# Patient Record
Sex: Female | Born: 1995 | Race: White | Hispanic: No | Marital: Married | Smoking: Never smoker
Health system: Southern US, Community
[De-identification: ages and names within clinical notes are randomized; demographics above are authoritative.]

## PROBLEM LIST (undated history)

## (undated) ENCOUNTER — Emergency Department (HOSPITAL_COMMUNITY): Admission: EM | Payer: Commercial Managed Care - PPO | Source: Home / Self Care

## (undated) DIAGNOSIS — F909 Attention-deficit hyperactivity disorder, unspecified type: Secondary | ICD-10-CM

## (undated) DIAGNOSIS — F419 Anxiety disorder, unspecified: Secondary | ICD-10-CM

## (undated) DIAGNOSIS — B001 Herpesviral vesicular dermatitis: Secondary | ICD-10-CM

## (undated) DIAGNOSIS — F329 Major depressive disorder, single episode, unspecified: Secondary | ICD-10-CM

## (undated) DIAGNOSIS — B009 Herpesviral infection, unspecified: Secondary | ICD-10-CM

## (undated) DIAGNOSIS — F32A Depression, unspecified: Secondary | ICD-10-CM

## (undated) HISTORY — DX: Herpesviral vesicular dermatitis: B00.1

## (undated) HISTORY — DX: Major depressive disorder, single episode, unspecified: F32.9

## (undated) HISTORY — PX: INTRAUTERINE DEVICE (IUD) INSERTION: SHX5877

## (undated) HISTORY — DX: Depression, unspecified: F32.A

## (undated) HISTORY — DX: Herpesviral infection, unspecified: B00.9

## (undated) HISTORY — PX: OTHER SURGICAL HISTORY: SHX169

---
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
## 2018-11-12 ENCOUNTER — Other Ambulatory Visit: Payer: Self-pay

## 2018-11-12 ENCOUNTER — Encounter: Payer: Self-pay | Admitting: Obstetrics & Gynecology

## 2018-11-12 ENCOUNTER — Ambulatory Visit (INDEPENDENT_AMBULATORY_CARE_PROVIDER_SITE_OTHER): Payer: Commercial Managed Care - PPO | Admitting: Obstetrics & Gynecology

## 2018-11-12 VITALS — BP 122/62 | HR 68 | Temp 98.1°F | Resp 16 | Ht 68.75 in | Wt 143.0 lb

## 2018-11-12 DIAGNOSIS — N6011 Diffuse cystic mastopathy of right breast: Secondary | ICD-10-CM | POA: Diagnosis not present

## 2018-11-12 DIAGNOSIS — N6012 Diffuse cystic mastopathy of left breast: Secondary | ICD-10-CM

## 2018-11-12 DIAGNOSIS — N644 Mastodynia: Secondary | ICD-10-CM

## 2018-11-12 NOTE — Progress Notes (Signed)
22 y.o. G0P0000 Single White or Caucasian female here as new patient for Left Breast lump.    Sees Starling Manns, PA, who helps manage her depression    Patient's last menstrual period was 11/10/2018 (exact date).          Sexually active: Yes.    The current method of family planning is condoms every time.    Exercising: No Smoker:  no  Health Maintenance: Pap:  Never TDaP:  2017 Gardasil: unsure Screening Labs: PCP   reports that she has never smoked. She has never used smokeless tobacco. She reports current alcohol use of about 2.0 standard drinks of alcohol per week. She reports that she does not use drugs.  History reviewed. No pertinent past medical history.  Past Surgical History:  Procedure Laterality Date  . 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 APLENZIN 174 MG TB24 Take 1 tablet by mouth daily.     . nitrofurantoin, macrocrystal-monohydrate, (MACROBID) 100 MG capsule Take 1 capsule by mouth 2 (two) times daily.     No current facility-administered medications for this visit.     History reviewed. No pertinent family history.  Review of Systems  All other systems reviewed and are negative.   Exam:   BP 122/62 (BP Location: Right Arm, Patient Position: Sitting, Cuff Size: Normal)   Pulse 68   Temp 98.1 F (36.7 C) (Oral)   Resp 16   Ht 5' 8.75" (1.746 m)   Wt 143 lb (64.9 kg)   LMP 11/10/2018 (Exact Date)   BMI 21.27 kg/m     Height: 5' 8.75" (174.6 cm)  Ht Readings from Last 3 Encounters:  11/12/18 5' 8.75" (1.746 m)   Physical Exam  Constitutional: She is oriented to person, place, and time. She appears well-developed and well-nourished.  Neck: Normal range of motion. Neck supple. No thyromegaly present.  Respiratory: Right breast exhibits no inverted nipple, no mass, no nipple discharge, no skin change and no tenderness. Left breast exhibits tenderness.  Left breast exhibits no inverted nipple, no mass, no nipple discharge and no skin change. Breasts are symmetrical.    Lymphadenopathy:    She has no cervical adenopathy.  Neurological: She is alert and oriented to person, place, and time.  Skin: Skin is warm and dry.  Psychiatric: She has a normal mood and affect.  .  A:  Breast "mass" that feels like a large area of fibrocystic change Breast tenderness  P:   Pt is going to monitor through the next menstrual cycle and see if pain improves.  Doubtful that she will have much change from physical exam standpoint as there is significant fibrocystic change present.  If pain continues or worsens, she knows to call and diagnostic ultrasound will be planned.  Typically, I would have pt return in one month for recheck but I do not think that is appropriate at this time given Covid 19 pandemic.  I do feel phone follow up is appropriate.  Pt comfortable with plan.

## 2018-11-12 NOTE — Patient Instructions (Signed)
Fibrocystic Breast Changes    Fibrocystic breast changes are changes in breast tissue that can cause breasts to become swollen, lumpy, or painful. This can happen due to buildup of scar-like tissue (fibrous tissue) or the forming of fluid-filled lumps (cysts) in the breast. This is a common condition, and it is not cancerous (is benign). The exact cause is not known, but it seems to occur when women go through hormonal changes during their menstrual cycle. Fibrocystic breast changes can affect one or both breasts.  What are the causes?  The exact cause of fibrocystic breast changes is not known. However, this condition:   May be related to the female hormones estrogen and progesterone.   May be influenced by family traits that get passed from parent to child (genetics).  What are the signs or symptoms?  Symptoms of this condition may affect one or both breasts, and may include:   Tenderness, mild discomfort, or pain.   Swelling.   Rope-like tissue that can be felt when touching the breast.   Lumps in one or both breasts.   Changes in breast size. Breasts may get larger before the menstrual period and smaller after the menstrual period.   Green or dark brown discharge from the nipple.  Symptoms are usually worse before menstrual periods start, and they get better toward the end of menstrual periods.  How is this diagnosed?  This condition is diagnosed based on your medical history and a physical exam of your breasts. You may also have tests, such as:   A breast X-ray (mammogram).   Ultrasound of your breasts.   MRI.   Removal of a breast tissue sample for testing (breast biopsy). This may be done if your health care provider thinks that something else may be causing changes in your breasts.  How is this treated?  Often, treatment is not needed for this condition. In some cases, treatment may include:   Taking over-the-counter pain relievers to help lessen pain or discomfort.   Limiting or avoiding  caffeine. Foods and beverages that contain caffeine include chocolate, soda, coffee, and tea.   Reducing sugar and fat in your diet.  Your health care provider may also recommend:   A procedure to remove fluid from a cyst that is causing pain (fine needle aspiration).   Surgery to remove a cyst that is large or tender or does not go away.  Follow these instructions at home:   Examine your breasts after every menstrual period. If you do not have menstrual periods, check your breasts on the first day of every month. Feel for changes in your breasts, such as:  ? More tenderness.  ? A new growth.  ? A change in size.  ? A change in an existing lump.   Take over-the-counter and prescription medicines only as told by your health care provider.   Wear a well-fitted support or sports bra, especially when exercising.   Decrease or avoid caffeine, fat, and sugar in your diet as directed by your health care provider.  Contact a health care provider if:   You have fluid leaking from your nipple, especially if it is bloody.   You have new lumps or bumps in your breast.   Your breast becomes enlarged, red, and painful.   You have areas of your breast that pucker inward.   Your nipple appears flat or indented.  Get help right away if:   You have redness of your breast and the redness is spreading.    Summary   Fibrocystic breast changes are changes in breast tissue that can cause breasts to become swollen, lumpy, or painful.   This condition may be related to the female hormones estrogen and progesterone.   With this condition, it is important to examine your breasts after every menstrual period. If you do not have menstrual periods, check your breasts on the first day of every month.  This information is not intended to replace advice given to you by your health care provider. Make sure you discuss any questions you have with your health care provider.  Document Released: 05/24/2006 Document Revised: 04/18/2016  Document Reviewed: 04/05/2016  Elsevier Interactive Patient Education  2019 Elsevier Inc.

## 2019-03-10 ENCOUNTER — Telehealth: Payer: Self-pay | Admitting: Obstetrics & Gynecology

## 2019-03-10 NOTE — Telephone Encounter (Signed)
Patient complaining of vaginal itching and odor. Requesting appointment this week with Dr.Miller. Made appointment 03-13-19 at 10:30am.

## 2019-03-10 NOTE — Telephone Encounter (Signed)
Patient thinks she has yeast or b.v. and would like an appointment this week with Dr.Miller. To triage to assist with scheduling.

## 2019-03-12 ENCOUNTER — Other Ambulatory Visit: Payer: Self-pay

## 2019-03-13 ENCOUNTER — Encounter: Payer: Self-pay | Admitting: Obstetrics & Gynecology

## 2019-03-13 ENCOUNTER — Ambulatory Visit (INDEPENDENT_AMBULATORY_CARE_PROVIDER_SITE_OTHER): Payer: Commercial Managed Care - PPO | Admitting: Obstetrics & Gynecology

## 2019-03-13 ENCOUNTER — Telehealth: Payer: Self-pay | Admitting: Obstetrics & Gynecology

## 2019-03-13 ENCOUNTER — Other Ambulatory Visit (HOSPITAL_COMMUNITY)
Admission: RE | Admit: 2019-03-13 | Discharge: 2019-03-13 | Disposition: A | Discharge status: Home / Self Care | Payer: Commercial Managed Care - PPO | Source: Ambulatory Visit | Attending: Obstetrics & Gynecology | Admitting: Obstetrics & Gynecology

## 2019-03-13 ENCOUNTER — Other Ambulatory Visit: Payer: Self-pay

## 2019-03-13 VITALS — BP 112/82 | HR 88 | Temp 97.7°F | Ht 68.75 in | Wt 148.0 lb

## 2019-03-13 DIAGNOSIS — N898 Other specified noninflammatory disorders of vagina: Secondary | ICD-10-CM | POA: Insufficient documentation

## 2019-03-13 DIAGNOSIS — B001 Herpesviral vesicular dermatitis: Secondary | ICD-10-CM | POA: Diagnosis not present

## 2019-03-13 DIAGNOSIS — Z3009 Encounter for other general counseling and advice on contraception: Secondary | ICD-10-CM

## 2019-03-13 DIAGNOSIS — N390 Urinary tract infection, site not specified: Secondary | ICD-10-CM | POA: Insufficient documentation

## 2019-03-13 MED ORDER — VALACYCLOVIR HCL 1 G PO TABS: ORAL_TABLET | ORAL | 1 refills | Status: DC

## 2019-03-13 NOTE — Telephone Encounter (Signed)
Spoke with patient in regards to benefit for a Kyleena IUD insertion. Patient understood information presented and is agreeable. Advised patient to call at the onset of her menses.  for scheduling.   Forwarding to Dr Sabra Heck for final review. Patient is agreeable to disposition. Will close encounter.

## 2019-03-13 NOTE — Progress Notes (Signed)
GYNECOLOGY  VISIT  CC:   Vaginal discharge, itching x 2 weeks   HPI: 23 y.o. G0P0000 Single White or Caucasian female here for complaint of vaginal discharge as well as some mild odor that has been present for about two weeks.  Has very mild itching as well.  Denies irregular bleeding.  Denies pelvic pain.  Denies fever.  Denies urinary symptoms.    She is SA.  Has had partner change.    Does have some issues with UTIs.  Using tele doc (app where prescription is prescribed).    H/O facial fever blisters that typically occur around her menstrual cycles.    Going to [employer] for the fall.  Rising junior.  Is considering IUD placement.  Wants to discuss today.  Differences in IUDs and placement, risks and benefits as well as typical side effects all discussed.    GYNECOLOGIC HISTORY: Patient's last menstrual period was 02/14/2019 (exact date). Contraception: condoms - every time  Menopausal hormone therapy: none  There are no active problems to display for this patient.   Past Medical History:  Diagnosis Date  . Depression     Past Surgical History:  Procedure Laterality Date  . KIDNEY SURGERY  12/1999   repair of renal reflux    MEDS:   Current Outpatient Medications on File Prior to Visit  Medication Sig Dispense Refill  . APLENZIN 174 MG TB24 Take 1 tablet by mouth daily.     . methylphenidate (RITALIN) 10 MG tablet Take 10 mg by mouth daily as needed.    . Multiple Vitamin (MULTIVITAMIN) tablet Take 1 tablet by mouth daily.     No current facility-administered medications on file prior to visit.     ALLERGIES: Codeine  History reviewed. No pertinent family history.  SH:  Single, non smoker  Review of Systems  Genitourinary: Positive for vaginal discharge.       Itching  Odor   All other systems reviewed and are negative.   PHYSICAL EXAMINATION:    BP 112/82   Pulse 88   Temp 97.7 F (36.5 C) (Temporal)   Ht 5' 8.75" (1.746 m)   Wt 148 lb (67.1 kg)   LMP  02/14/2019 (Exact Date)   BMI 22.02 kg/m     General appearance: alert, cooperative and appears stated age Lymph:  no inguinal LAD noted  Pelvic: External genitalia:  no lesions              Urethra:  normal appearing urethra with no masses, tenderness or lesions              Bartholins and Skenes: normal                 Vagina: normal appearing vagina with normal color and discharge, no lesions              Cervix: no lesions              Bimanual Exam:  Uterus:  normal size, contour, position, consistency, mobility, non-tender              Adnexa: no mass, fullness, tenderness   Chaperone was present for exam.  Assessment: Vaginal discharge SA Recurrent UTIs Fever blisters Considering long acting contraception.  Information provided.  Plan: Affirm pending GC/CHl pending Rx for Valtrex 1gm, 2 tabs x 2 doses 12 hours apart Recommended having actual testing with urine cultures to document recurrent UTIs vs IC.  If cultures are positive, then would recommend  treatment for recurrent UTIs.  ~25 minutes spent with patient >50% of time was in face to face discussion of above.

## 2019-03-14 LAB — VAGINITIS/VAGINOSIS, DNA PROBE
Candida Species: POSITIVE — AB
Gardnerella vaginalis: NEGATIVE
Trichomonas vaginosis: NEGATIVE

## 2019-03-14 LAB — GC/CHLAMYDIA PROBE AMP (~~LOC~~) NOT AT ARMC
Chlamydia: NEGATIVE
Neisseria Gonorrhea: NEGATIVE

## 2019-03-17 MED ORDER — FLUCONAZOLE 150 MG PO TABS: ORAL_TABLET | ORAL | 0 refills | Status: DC

## 2019-03-17 NOTE — Addendum Note (Signed)
Addended by: Megan Salon on: 03/17/2019 05:45 PM   Modules accepted: Orders

## 2019-03-25 ENCOUNTER — Encounter: Payer: Self-pay | Admitting: Obstetrics & Gynecology

## 2019-03-26 ENCOUNTER — Telehealth: Payer: Self-pay | Admitting: Obstetrics & Gynecology

## 2019-03-26 ENCOUNTER — Other Ambulatory Visit: Payer: Self-pay | Admitting: Obstetrics & Gynecology

## 2019-03-26 NOTE — Telephone Encounter (Signed)
Forwarding to Dr. Miller.

## 2019-03-26 NOTE — Telephone Encounter (Signed)
Left message to call Sharee Pimple, RN at [facility].    OV on 03/13/19, vaginitis testing positive for yeast, tx on 03/17/19 with diflucan x2.

## 2019-03-26 NOTE — Telephone Encounter (Signed)
Patient sent the following correspondence through [place]. Routing to triage to assist patient with request.  Hey Dr. Sabra Heck,  Thanks for all your help at my appointment and taking the time to listen and answer my questions. I've taken both pills for my yeast infection, but it seems like it hasn't cleared up. I still have that odd discharge, odor, and now itching. What do you suggest I do?   Thanks,   [name] [name]  [phone]

## 2019-03-27 MED ORDER — TERCONAZOLE 0.4 % VA CREA: 1.0000 | TOPICAL_CREAM | Freq: Every day | VAGINAL | 0 refills | Status: DC

## 2019-03-27 NOTE — Telephone Encounter (Signed)
Ok to treat with terazol 7 nightly x 7 nights.  If not improved, need to see provider on campus if possible.  Thanks.

## 2019-03-27 NOTE — Telephone Encounter (Signed)
Spoke with patient, advised as seen below per Dr. Sabra Heck, Rx to verified pharmacy. Patient verbalizes understanding and is agreeable.   Encounter closed.

## 2019-03-27 NOTE — Telephone Encounter (Signed)
Spoke with patient. Treated for yeast on 03/17/19, menses started so she waited to take diflucan until after menses. Has completed 2 doses of diflucan and symptoms have not resolved. Reports vaginal odor, white d/c with "weird texture" and itching. Patient declines OV on 8/7, states she is leaving for college in the morning. Advised I will review with Dr. Sabra Heck and return call, patient agreeable.   Dr. Sabra Heck -please advise.

## 2019-03-27 NOTE — Telephone Encounter (Signed)
Patient states she is returning call to [name].

## 2019-07-15 ENCOUNTER — Other Ambulatory Visit: Payer: Self-pay | Admitting: Certified Nurse Midwife

## 2019-07-15 ENCOUNTER — Ambulatory Visit: Payer: Commercial Managed Care - PPO | Admitting: Certified Nurse Midwife

## 2019-07-15 ENCOUNTER — Other Ambulatory Visit: Payer: Self-pay

## 2019-07-15 ENCOUNTER — Telehealth: Payer: Self-pay | Admitting: Certified Nurse Midwife

## 2019-07-15 ENCOUNTER — Telehealth: Payer: Self-pay | Admitting: Obstetrics & Gynecology

## 2019-07-15 MED ORDER — VALACYCLOVIR HCL 1 G PO TABS: ORAL_TABLET | ORAL | 0 refills | Status: DC

## 2019-07-15 NOTE — Telephone Encounter (Signed)
Spoke with pt. Pt states has left breast lump that is hard, but movable. Noticed 1 week ago. Pt denies nipple discharge, pain. Aware that Dr Sabra Heck is out of office and ok to see another provider. Pt scheduled OV with Johny Shock, CNM on 07/15/19 at 2:30pm. Covid screen negative. Pt aware of arrival at 2:15pm for appt. Pt verbalized understanding.   Routing to provider for final review. Patient is agreeable to disposition. Will close encounter.  FYI Dr Sabra Heck

## 2019-07-15 NOTE — Telephone Encounter (Addendum)
Medication refill request: Valtrex Last AEX:  11-12-2018 SM  Next AEX: not currently scheduled Last MMG (if hormonal medication request): n/a Refill authorized: Today, please advise.   Spoke with patient. Patient states she can feel a fever blister coming on. Requesting refill of valtrex to [facility] on [address]. Patient also rescheduled breast check to tomorrow at 1100 with Debbi. Patient agreeable to date and time of appointment.

## 2019-07-15 NOTE — Telephone Encounter (Signed)
Patient has another breast lump and would like to be seen this week if possible. She is aware Dr.Miller is out of the office, okay to see any provider.

## 2019-07-15 NOTE — Telephone Encounter (Signed)
Patient has questions regarding valacyclovir. States she also needs a refill.

## 2019-07-15 NOTE — Telephone Encounter (Signed)
Patient cancelled today's appointment. States her car is being worked on and will not be finished in time for her to make it to the appointment. She will call back next week to reschedule after checking her work schedule.

## 2019-07-15 NOTE — Progress Notes (Signed)
   Subjective:   23 y.o. Single Caucasian female presents for evaluation of left breast mass. Onset of the symptoms was  7  days. Patient sought evaluation because of breast lump and breast tenderness on left.  Contributing factors include family hx with mother( recent information) patient adopted. Denies no fever, chills or fever or redness in area of concerns. Patient denies history of trauma, bites, or injuries. Last mammogram was none.  Previous evaluation has included office visit with fibrocystic breast diagnosis.  Currently on final days of period, duration 5 days. Has noted this type of area before and occurs with premenstrual and resolves with period. Just wanted to make sure no issues.. No medication changes or other health issues today. Student at [employer]    Review of Systems Pertinent items noted in HPI and remainder of comprehensive ROS otherwise negative.   Objective:   General appearance: alert, cooperative and appears stated age  Breasts: normal appearance, no masses or tenderness, No nipple retraction or dimpling, No nipple discharge or bleeding, No axillary or supraclavicular adenopathy, Left breast marble size cystic feel mass, mobile, slightly tender at 9 o'clock 1 FB from outer edge noted, also noted fibrocystic feel in breast at 2 and 4 o'clock, patient states these resolve after period Right breast: fibrocystic feel, but no isolated areas noted, no nipple discharge of axillary or supraclavicular adenopathy noted. Generalized tenderness with period only. Physical Exam Chest:       Assessment:   ASSESSMENT:Patient is diagnosed with menstrual fibrocystic changes in breast, left breast cyst fibrocystic feel, no  Infection, patient feels is reducing in size. History of fibrocystic breasts. Family of breast cancer mother 75's   Plan:   PLAN: Discussed findings of fibrocystic breast tissue and cyst in left. Discussed limiting caffeine and wearing supportive bra throughout  her period.Needs to come in one week for recheck. Will advise if change prior to appointment.  Rv as above, prn n

## 2019-07-15 NOTE — Progress Notes (Deleted)
   Subjective:   23 y.o. Single{Race/ethnicity:17218} female presents for evaluation of {Right/left:16020} breast mass. Onset of the symptoms was{TIME UNITS:19995}. Patient sought evaluation because of {Symptoms; ROS skin/breast:30521}.  Contributing factors include {Causes; risk factors breast ca:12807}. Denies {Constitutional Sx:60209}. Patient denies hiistory of trauma, bites, or injuries. Last mammogram was {Time; 1 month to 1 year:14528}.  Previous evaluation has included{Procedures; breast eval:31381}   Review of Systems {Ros - Complete:30496}@SUBJECTIVE    Objective:   @General  appearance: {general exam:16600} Head: {head exam:30909::"Normocephalic, without obvious abnormality","atraumatic"} Neck: {neck exam:17463::"no adenopathy","no carotid bruit","no JVD","supple, symmetrical, trachea midline","thyroid not enlarged, symmetric, no tenderness/mass/nodules"} Back: {back exam:801::"symmetric, no curvature. ROM normal. No CVA tenderness."} Lungs: {lung exam:16931} Breasts: {breast exam:13139::"normal appearance, no masses or tenderness"} Heart: {heart exam:5510} Abdomen: {abdominal exam:16834}    Assessment:   @ASSESSMENT :Patient is diagnosed with {DIAGNOSES; IYMEBR:83094}   Plan:   @PLAN : The patient {has/does not have:19846} a documented plan to follow with further care of {Diagnostic/therapeutic plan md:30626} on {DATE MONTH DAY MHWK:088110315} 2. PLAN: FOLLOW {plan; follow-up ent:15048}

## 2019-07-16 ENCOUNTER — Ambulatory Visit (INDEPENDENT_AMBULATORY_CARE_PROVIDER_SITE_OTHER): Payer: Commercial Managed Care - PPO | Admitting: Certified Nurse Midwife

## 2019-07-16 ENCOUNTER — Other Ambulatory Visit: Payer: Self-pay

## 2019-07-16 ENCOUNTER — Encounter: Payer: Self-pay | Admitting: Certified Nurse Midwife

## 2019-07-16 VITALS — BP 100/64 | HR 64 | Temp 97.1°F | Resp 16 | Wt 136.0 lb

## 2019-07-16 DIAGNOSIS — N6002 Solitary cyst of left breast: Secondary | ICD-10-CM

## 2019-07-16 DIAGNOSIS — N6012 Diffuse cystic mastopathy of left breast: Secondary | ICD-10-CM

## 2019-07-16 DIAGNOSIS — N6011 Diffuse cystic mastopathy of right breast: Secondary | ICD-10-CM

## 2019-07-16 MED ORDER — VALACYCLOVIR HCL 1 G PO TABS: ORAL_TABLET | ORAL | 0 refills | Status: DC

## 2019-07-16 NOTE — Patient Instructions (Signed)
Fibrocystic Breast Changes  Fibrocystic breast changes are changes that can make your breasts swollen or painful. These changes happen when tiny sacs of fluid (cysts) form in the breast. This is a common condition. It does not mean that you have cancer. It usually happens because of hormone changes during a monthly period. Follow these instructions at home:  Check your breasts after every monthly period. If you do not have monthly periods, check your breasts on the first day of every month. Check for: ? Soreness. ? New swelling or puffiness. ? A change in breast size. ? A change in a lump that was already there.  Take over-the-counter and prescription medicines only as told by your doctor.  Wear a support or sports bra that fits well. Wear this support especially when you are exercising.  Avoid or have less caffeine, fat, and sugar in what you eat and drink as told by your doctor. Contact a doctor if:  You have fluid coming from your nipple, especially if the fluid has blood in it.  You have new lumps or bumps in your breast.  Your breast gets puffy, red, and painful.  You have changes in how your breast looks.  Your nipple looks flat or it sinks into your breast. Get help right away if:  Your breast turns red, and the redness is spreading. Summary  Fibrocystic breast changes are changes that can make your breasts swollen or painful.  This condition can happen when you have hormone changes during your monthly period.  With this condition, it is important to check your breasts after every monthly period. If you do not have monthly periods, check your breasts on the first day of every month. This information is not intended to replace advice given to you by your health care provider. Make sure you discuss any questions you have with your health care provider. Document Released: 07/20/2008 Document Revised: 11/28/2018 Document Reviewed: 04/20/2016 Elsevier Patient Education  2020  Elsevier Inc.  

## 2019-08-22 DIAGNOSIS — A749 Chlamydial infection, unspecified: Secondary | ICD-10-CM

## 2019-08-22 HISTORY — DX: Chlamydial infection, unspecified: A74.9

## 2019-10-07 ENCOUNTER — Telehealth: Payer: Self-pay | Admitting: *Deleted

## 2019-10-07 DIAGNOSIS — N898 Other specified noninflammatory disorders of vagina: Secondary | ICD-10-CM

## 2019-10-07 NOTE — Telephone Encounter (Signed)
Spoke to pt. Pt states having strep throat x 2 weeks ago and had taken  Amoxicillin x 7 days  and now having sx of white discharge and itching.  Pt requests Rx for yeast infection. Pt states is at school and cannot get to [facility] for appt. Pt has not been successful getting appt with student health on [employer] [facility] because overloaded.. Will return call after reviewing with Dr Hyacinth Meeker. Pt aware if gets Rx, then if not better, then to seek urgent care near her. Pt agreeable.   Routing to Dr Hyacinth Meeker for review and recommendations. Rx for Diflucan #2, 0RF pended if approved. Pharmacy verified.

## 2019-10-07 NOTE — Telephone Encounter (Signed)
After speaking with Dr Hyacinth Meeker. Call returned to pt. Pt given recommendations to try 3 day Monistat OTC. Pt agreeable. Pt also advised to eat live active culture yogurts and be panty free at night to help yeast. Pt agreeable.   Routing to Dr Hyacinth Meeker for final review and will close encounter.

## 2019-10-07 NOTE — Telephone Encounter (Signed)
Left message for pt to return call to office and speak with triage RN.

## 2019-10-07 NOTE — Telephone Encounter (Signed)
Patient is at school and having yeast infection symptoms. Would like something called in if possible.

## 2019-10-17 ENCOUNTER — Other Ambulatory Visit: Payer: Self-pay | Admitting: Obstetrics & Gynecology

## 2019-10-17 ENCOUNTER — Telehealth: Payer: Self-pay | Admitting: Obstetrics & Gynecology

## 2019-10-17 DIAGNOSIS — N898 Other specified noninflammatory disorders of vagina: Secondary | ICD-10-CM

## 2019-10-17 MED ORDER — FLUCONAZOLE 150 MG PO TABS: 150.0000 mg | ORAL_TABLET | Freq: Once | ORAL | 0 refills | Status: AC

## 2019-10-17 NOTE — Telephone Encounter (Signed)
Spoke to pt. Pt states having discharge with itching. Denies pain or odor or urinary sx. Pt tried Monistat 3 day per Dr Hyacinth Meeker on 10/07/2019. Pt now requesting Diflucan Rx. 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   Rx pended #2 tablets, 0RF if approved. Pharmacy verified.   Routing to Dr Hyacinth Meeker

## 2019-10-17 NOTE — Telephone Encounter (Signed)
Patient tried monistat for yeast symptoms with no relief. Requesting prescription for diflucan be sent to [facility] in [place] at 252 [phone] where she is in school.

## 2019-10-28 ENCOUNTER — Other Ambulatory Visit: Payer: Self-pay

## 2019-10-28 MED ORDER — VALACYCLOVIR HCL 1 G PO TABS: ORAL_TABLET | ORAL | 0 refills | Status: DC

## 2019-10-28 NOTE — Telephone Encounter (Signed)
Medication refill request: valacyclovir hcl 1 gram tablet Last OV 07-16-2019 Next AEX: not scheduled Last MMG (if hormonal medication request): n/a Refill authorized: last refilled 07-16-2019 #30 with 0 refills. Please approve if appropriate. Pharmacy note also states for patient to call to schedule yearly annual exam.

## 2019-11-03 ENCOUNTER — Encounter: Payer: Self-pay | Admitting: Certified Nurse Midwife

## 2019-11-04 ENCOUNTER — Other Ambulatory Visit: Payer: Self-pay | Admitting: Obstetrics and Gynecology

## 2019-11-05 ENCOUNTER — Encounter: Payer: Self-pay | Admitting: Certified Nurse Midwife

## 2019-12-22 ENCOUNTER — Other Ambulatory Visit: Payer: Self-pay | Admitting: Obstetrics and Gynecology

## 2019-12-23 NOTE — Telephone Encounter (Signed)
Medication refill request: Valtrex Last OV:  07-16-2019 DL  Next AEX: message left to call and schedule Last MMG (if hormonal medication request): n/a Refill authorized: Today, please advise.   Medication pended for #30, 0RF. Please refill if appropriate.

## 2019-12-23 NOTE — Telephone Encounter (Signed)
Patient scheduled for aex on 01-27-20 at 0830 with Dr. Edward Jolly. Patient agreeable to date and time of appointment.   Encounter closed.

## 2019-12-30 ENCOUNTER — Other Ambulatory Visit: Payer: Self-pay | Admitting: Obstetrics & Gynecology

## 2020-01-26 NOTE — Progress Notes (Signed)
24 y.o. G1P0000 Single Caucasian female here for annual exam.    Patient feels like she has had recurrent yeast infection since 08/2019. She feels like she has an ongoing yeast infection or multiple infections.  Been treated by phone with Diflucan.  She has discharge, odor, and itching.   Patient had Rutha Bouchard IUD inserted in [place] 11-13-19 and has had irregular bleeding.  Her menses last 2 - 3 weeks.  She is happy with the IUD.   Wants to talk about taking Valtrex for cold sores.  Denies current depression issues.   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  No Covid vaccine yet.   PCP:  None   Patient's last menstrual period was 12/29/2019.     Period Cycle (Days): (IUD)     Sexually active: Yes.    The current method of family planning is IUD--Kyleena 11-13-19 inserted [place].    Exercising: Yes.    walking Smoker:  no  Health Maintenance: Pap:  n/a History of abnormal Pap:  n/a MMG:  n/a Colonoscopy:  n/a BMD:   n/a  Result  n/a TDaP:  2017 Gardasil:   Unsure--thinks so HIV:no Hep C:no Screening Labs:  Today.    reports that she has never smoked. She has never used smokeless tobacco. She reports current alcohol use. She reports that she does not use drugs.  Past Medical History:  Diagnosis Date  . Depression   . Fever blister   . HSV-1 infection     Past Surgical History:  Procedure Laterality Date  . KIDNEY SURGERY  12/1999   repair of renal reflux    Current Outpatient Medications  Medication Sig Dispense Refill  . Multiple Vitamin (MULTIVITAMIN) tablet Take 1 tablet by mouth daily.    . valACYclovir (VALTREX) 1000 MG tablet 2 TAB X 1 DOSE AND REPEAT IN 12 HOURS WHEN NEEDED FOR FEVER BLISTER 30 tablet 0   No current facility-administered medications for this visit.    History reviewed. No pertinent family history.  Review of Systems  All other systems reviewed and are negative.   Exam:    BP 100/70   Pulse 66   Temp 97.8 F (36.6 C) (Temporal)   Resp 14   Ht 5' 7.75" (1.721 m)   Wt 130 lb 9.6 oz (59.2 kg)   LMP 12/29/2019   BMI 20.00 kg/m     General appearance: alert, cooperative and appears stated age Head: normocephalic, without obvious abnormality, atraumatic Neck: no adenopathy, supple, symmetrical, trachea midline and thyroid normal to inspection and palpation Lungs: clear to auscultation bilaterally Breasts: normal appearance, no masses or tenderness, No nipple retraction or dimpling, No nipple discharge or bleeding, No axillary adenopathy Heart: regular rate and rhythm Abdomen: soft, non-tender; no masses, no organomegaly Extremities: extremities normal, atraumatic, no cyanosis or edema Skin: skin color, texture, turgor normal. No rashes or lesions Lymph nodes: cervical, supraclavicular, and axillary nodes normal. Neurologic: grossly normal  Pelvic: External genitalia:  no lesions              No abnormal inguinal nodes palpated.              Urethra:  normal appearing urethra with no masses, tenderness or lesions              Bartholins and Skenes: normal                 Vagina: normal appearing vagina with normal color and discharge,  no lesions              Cervix: no lesions.  IUD strings noted.               Pap taken: Yes.   Bimanual Exam:  Uterus:  normal size, contour, position, consistency, mobility, non-tender              Adnexa: no mass, fullness, tenderness          Chaperone was present for exam.  Assessment:   Well woman visit with normal exam. Kyleena IUD.  Recurrent vaginitis.  HSV I. Left breast mass.   Plan: Will schedule left breast US.  Self breast awareness reviewed. Pap and HR HPV as above. STD screening and routine labs.  Vaginitis testing.  Guidelines for Calcium, Vitamin D, regular exercise program including cardiovascular and weight bearing exercise. Rx for Valtrex daily for prophylaxis with increased dosage for HSV  I outbreak.  See Rx.    Follow up annually and prn.   After visit summary provided.

## 2020-01-27 ENCOUNTER — Encounter: Payer: Self-pay | Admitting: Obstetrics and Gynecology

## 2020-01-27 ENCOUNTER — Ambulatory Visit (INDEPENDENT_AMBULATORY_CARE_PROVIDER_SITE_OTHER): Payer: Commercial Managed Care - PPO | Admitting: Obstetrics and Gynecology

## 2020-01-27 ENCOUNTER — Other Ambulatory Visit: Payer: Self-pay

## 2020-01-27 ENCOUNTER — Other Ambulatory Visit (HOSPITAL_COMMUNITY)
Admission: RE | Admit: 2020-01-27 | Discharge: 2020-01-27 | Disposition: A | Discharge status: Home / Self Care | Payer: Commercial Managed Care - PPO | Source: Ambulatory Visit | Attending: Obstetrics and Gynecology | Admitting: Obstetrics and Gynecology

## 2020-01-27 ENCOUNTER — Telehealth: Payer: Self-pay | Admitting: Obstetrics and Gynecology

## 2020-01-27 ENCOUNTER — Other Ambulatory Visit: Payer: Self-pay | Admitting: Obstetrics and Gynecology

## 2020-01-27 VITALS — BP 100/70 | HR 66 | Temp 97.8°F | Resp 14 | Ht 67.75 in | Wt 130.6 lb

## 2020-01-27 DIAGNOSIS — Z113 Encounter for screening for infections with a predominantly sexual mode of transmission: Secondary | ICD-10-CM | POA: Diagnosis present

## 2020-01-27 DIAGNOSIS — N761 Subacute and chronic vaginitis: Secondary | ICD-10-CM

## 2020-01-27 DIAGNOSIS — Z01419 Encounter for gynecological examination (general) (routine) without abnormal findings: Secondary | ICD-10-CM | POA: Diagnosis not present

## 2020-01-27 DIAGNOSIS — N6320 Unspecified lump in the left breast, unspecified quadrant: Secondary | ICD-10-CM | POA: Diagnosis not present

## 2020-01-27 DIAGNOSIS — N6325 Unspecified lump in the left breast, overlapping quadrants: Secondary | ICD-10-CM

## 2020-01-27 MED ORDER — VALACYCLOVIR HCL 500 MG PO TABS: ORAL_TABLET | ORAL | 3 refills | Status: DC

## 2020-01-27 NOTE — Telephone Encounter (Signed)
Call placed to [facility] at [facility], pt scheduled for left breast US on 01/30/2020 at 0930 am.  Call placed to pt. Spoke to pt. Pt given appt at [facility]. Pt states going back to [place] this Friday and will be back in [place] at end of June. Pt will call and change appt at [facility]. Number given. Pt agreeable and verbalized understanding.   Routing to Dr Edward Jolly for review and co-sign orders placed by [facility].  Cc: Noreene Larsson, RN for [employer] closed

## 2020-01-27 NOTE — Telephone Encounter (Signed)
Please contact patient to schedule left breast US at [facility].   Patient has a 1 cm left breast mass at 9:00. No prior imaging.

## 2020-01-27 NOTE — Patient Instructions (Signed)

## 2020-01-27 NOTE — Telephone Encounter (Signed)
Patient placed in MMG hold.  

## 2020-01-28 LAB — CERVICOVAGINAL ANCILLARY ONLY
Bacterial Vaginitis (gardnerella): NEGATIVE
Candida Glabrata: NEGATIVE
Candida Vaginitis: NEGATIVE
Chlamydia: NEGATIVE
Comment: NEGATIVE
Comment: NEGATIVE
Comment: NEGATIVE
Comment: NEGATIVE
Comment: NEGATIVE
Comment: NORMAL
Neisseria Gonorrhea: NEGATIVE
Trichomonas: NEGATIVE

## 2020-01-28 LAB — COMPREHENSIVE METABOLIC PANEL
ALT: 11 IU/L (ref 0–32)
AST: 17 IU/L (ref 0–40)
Albumin/Globulin Ratio: 1.8 (ref 1.2–2.2)
Albumin: 4.9 g/dL (ref 3.9–5.0)
Alkaline Phosphatase: 72 IU/L (ref 48–121)
BUN/Creatinine Ratio: 15 (ref 9–23)
BUN: 14 mg/dL (ref 6–20)
Bilirubin Total: 0.3 mg/dL (ref 0.0–1.2)
CO2: 23 mmol/L (ref 20–29)
Calcium: 9.9 mg/dL (ref 8.7–10.2)
Chloride: 102 mmol/L (ref 96–106)
Creatinine, Ser: 0.91 mg/dL (ref 0.57–1.00)
GFR calc Af Amer: 104 mL/min/{1.73_m2} (ref >59)
GFR calc non Af Amer: 90 mL/min/{1.73_m2} (ref >59)
Globulin, Total: 2.8 g/dL (ref 1.5–4.5)
Glucose: 85 mg/dL (ref 65–99)
Potassium: 4.1 mmol/L (ref 3.5–5.2)
Sodium: 139 mmol/L (ref 134–144)
Total Protein: 7.7 g/dL (ref 6.0–8.5)

## 2020-01-28 LAB — LIPID PANEL
Chol/HDL Ratio: 2.8 ratio (ref 0.0–4.4)
Cholesterol, Total: 158 mg/dL (ref 100–199)
HDL: 57 mg/dL (ref >39)
LDL Chol Calc (NIH): 82 mg/dL (ref 0–99)
Triglycerides: 105 mg/dL (ref 0–149)
VLDL Cholesterol Cal: 19 mg/dL (ref 5–40)

## 2020-01-28 LAB — HEP, RPR, HIV PANEL
HIV Screen 4th Generation wRfx: NONREACTIVE
Hepatitis B Surface Ag: NEGATIVE
RPR Ser Ql: NONREACTIVE

## 2020-01-28 LAB — CBC
Hematocrit: 42.4 % (ref 34.0–46.6)
Hemoglobin: 14.1 g/dL (ref 11.1–15.9)
MCH: 31.3 pg (ref 26.6–33.0)
MCHC: 33.3 g/dL (ref 31.5–35.7)
MCV: 94 fL (ref 79–97)
Platelets: 259 10*3/uL (ref 150–450)
RBC: 4.51 x10E6/uL (ref 3.77–5.28)
RDW: 13.4 % (ref 11.7–15.4)
WBC: 8 10*3/uL (ref 3.4–10.8)

## 2020-01-28 LAB — CYTOLOGY - PAP: Diagnosis: NEGATIVE

## 2020-01-28 LAB — HEPATITIS C ANTIBODY: Hep C Virus Ab: <0.1 s/co ratio (ref 0.0–0.9)

## 2020-01-30 ENCOUNTER — Other Ambulatory Visit: Payer: Commercial Managed Care - PPO

## 2020-02-16 ENCOUNTER — Other Ambulatory Visit: Payer: Commercial Managed Care - PPO

## 2020-03-17 ENCOUNTER — Telehealth: Payer: Self-pay | Admitting: *Deleted

## 2020-03-17 NOTE — Telephone Encounter (Signed)
Patient is in [facility] hold for left breast US for left breast lump on 01/27/20.  Not completed or scheduled.    Call to patient, Left message to call Noreene Larsson, RN at [facility] [phone].

## 2020-03-23 NOTE — Telephone Encounter (Signed)
Call to patient, no answer, unable to leave message, voicemail full.

## 2020-04-01 NOTE — Telephone Encounter (Signed)
Letter signed and returned to you.  Ok to remove from mammogram hold.

## 2020-04-01 NOTE — Telephone Encounter (Signed)
Letter mailed via standard [place] mail to address on file.  Removed from MMG hold.   Encounter closed.

## 2020-04-01 NOTE — Telephone Encounter (Addendum)
No return call from patient.   Letter pended. Printed letter to Dr. Edward Jolly to review.   Routing to Dr. Edward Jolly  OK to send letter and remove from [facility] hold?

## 2020-04-12 ENCOUNTER — Telehealth: Payer: Self-pay

## 2020-04-12 NOTE — Telephone Encounter (Signed)
Patients mother called in regards to advice for a PCP for patient.

## 2020-04-12 NOTE — Telephone Encounter (Signed)
Left message for pt to return call to triage RN. 

## 2020-04-16 NOTE — Telephone Encounter (Signed)
Left message for pt to return call to triage RN. 

## 2020-04-20 NOTE — Telephone Encounter (Signed)
Sent pt and mother Mychart message with detailed information. Pt or mother to return call to office with any additional questions.  Encounter closed.

## 2020-05-17 ENCOUNTER — Telehealth: Payer: Self-pay

## 2020-05-17 ENCOUNTER — Ambulatory Visit (INDEPENDENT_AMBULATORY_CARE_PROVIDER_SITE_OTHER): Payer: Commercial Managed Care - PPO | Admitting: Obstetrics and Gynecology

## 2020-05-17 ENCOUNTER — Other Ambulatory Visit: Payer: Self-pay

## 2020-05-17 ENCOUNTER — Encounter: Payer: Self-pay | Admitting: Obstetrics and Gynecology

## 2020-05-17 ENCOUNTER — Other Ambulatory Visit (HOSPITAL_COMMUNITY)
Admission: RE | Admit: 2020-05-17 | Discharge: 2020-05-17 | Disposition: A | Discharge status: Home / Self Care | Payer: Commercial Managed Care - PPO | Source: Ambulatory Visit | Attending: Obstetrics and Gynecology | Admitting: Obstetrics and Gynecology

## 2020-05-17 ENCOUNTER — Telehealth: Payer: Self-pay | Admitting: Obstetrics and Gynecology

## 2020-05-17 VITALS — BP 110/66 | HR 76 | Ht 67.75 in | Wt 133.0 lb

## 2020-05-17 DIAGNOSIS — Z113 Encounter for screening for infections with a predominantly sexual mode of transmission: Secondary | ICD-10-CM | POA: Insufficient documentation

## 2020-05-17 DIAGNOSIS — N6320 Unspecified lump in the left breast, unspecified quadrant: Secondary | ICD-10-CM

## 2020-05-17 NOTE — Patient Instructions (Signed)

## 2020-05-17 NOTE — Telephone Encounter (Signed)
Call placed to pt. No answer, VM box full, no message left. Will sent pt mychart message.

## 2020-05-17 NOTE — Telephone Encounter (Signed)
Pt returned call. Spoke with pt. Pt states needing STD testing and refill on Valtrex Rx. Pt advised to have OV. Pt agreeable. Pt states prefers either Dr Hyacinth Meeker or Dr Edward Jolly. Pt scheduled with Dr Edward Jolly today 05/17/20 at 2 pm. Pt verbalized understanding. Pt did not give any details due to leaving work at this time.  Encounter closed.

## 2020-05-17 NOTE — Telephone Encounter (Signed)
Patient sent the following message via MyChart.  Appointment Request From: [name] [name]  With Provider: Jerene Bears, MD [facility]]  Preferred Date Range: 05/17/2020 - 05/18/2020  Preferred Times: Any Time  Reason for visit: Office Visit  Comments: STD testing, valacyclovir refill

## 2020-05-17 NOTE — Telephone Encounter (Signed)
Spoke with Marcelino Duster at [facility].  Patient scheduled for left breast US on 06/01/20, arrive at 1:30pm, 1:50pm appt.  Placed in MMG hold.    Call placed to patient, no answer, mailbox full.   MyChart message to patient with appt details.

## 2020-05-17 NOTE — Progress Notes (Signed)
GYNECOLOGY  VISIT   HPI: 24 y.o.   Single  Caucasian  female   G0P0000 with Patient's last menstrual period was 04/30/2020 (exact date).   here for STD testing. Boyfriend has been seeing other people.  No pelvic pain, vaginal bleeding or discharge.   She was treated for chlamydia in March or April with Azithromycin.  She did retesting and it returned negative. She reports she had a painful inguinal left lymph node.   Patient has a hx of left breast mass 1 cm at 9:00.  No imaging was performed due to her school schedule.  She thinks it is smaller in size.   She is taking Valtrex daily around her menses.   Studying on line.   GYNECOLOGIC HISTORY: Patient's last menstrual period was 04/30/2020 (exact date). Contraception: Kyleena IUD 11/13/19 Menopausal hormone therapy:  n/a Last mammogram:  n/a Last pap smear:  01-27-20 Neg        OB History    Gravida  0   Para  0   Term  0   Preterm  0   AB  0   Living  0     SAB  0   TAB  0   Ectopic  0   Multiple  0   Live Births  0              Patient Active Problem List   Diagnosis Date Noted  . Fever blister 03/13/2019  . 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 Chlamydia 2021  . Depression   . Fever blister   . HSV-1 infection     Past Surgical History:  Procedure Laterality Date  . KIDNEY SURGERY  12/1999   repair of renal reflux    Current Outpatient Medications  Medication Sig Dispense Refill  . Multiple Vitamin (MULTIVITAMIN) tablet Take 1 tablet by mouth daily.    . valACYclovir (VALTREX) 500 MG tablet Take one tablet (500 mg) by mouth daily.  Take 4 tablets (2000 mg) by mouth every 12 hours for 24 hours for an outbreak. 100 tablet 3   No current facility-administered medications for this visit.     ALLERGIES: Codeine  History reviewed. No pertinent family history.  Social History   Socioeconomic History  . Marital status: Single    Spouse name: Not on  file  . Number of children: Not on file  . Years of education: Not on file  . Highest education level: Not on file  Occupational History  . Not on file  Tobacco Use  . Smoking status: Never Smoker  . Smokeless tobacco: Never Used  Vaping Use  . Vaping Use: Never used  Substance and Sexual Activity  . Alcohol use: Yes    Comment: 2 glasses of wine/month  . Drug use: Never  . Sexual activity: Yes    Birth control/protection: I.U.D.    Comment: Kyleena IUD 11-13-19 in [place]  Other Topics Concern  . Not on file  Social History Narrative  . Not on file   Social Determinants of Health   Financial Resource Strain:   . Difficulty of Paying Living Expenses: Not on file  Food Insecurity:   . Worried About Programme researcher, broadcasting/film/video in the Last Year: Not on file  . Ran Out of Food in the Last Year: Not on file  Transportation Needs:   . Lack of Transportation (Medical): Not on file  . Lack of Transportation (Non-Medical): Not on  file  Physical Activity:   . Days of Exercise per Week: Not on file  . Minutes of Exercise per Session: Not on file  Stress:   . Feeling of Stress : Not on file  Social Connections:   . Frequency of Communication with Friends and Family: Not on file  . Frequency of Social Gatherings with Friends and Family: Not on file  . Attends Religious Services: Not on file  . Active Member of Clubs or Organizations: Not on file  . Attends Banker Meetings: Not on file  . Marital Status: Not on file  Intimate Partner Violence:   . Fear of Current or Ex-Partner: Not on file  . Emotionally Abused: Not on file  . Physically Abused: Not on file  . Sexually Abused: Not on file    Review of Systems  All other systems reviewed and are negative.   PHYSICAL EXAMINATION:    BP 110/66   Pulse 76   Ht 5' 7.75" (1.721 m)   Wt 133 lb (60.3 kg)   LMP 04/30/2020 (Exact Date)   BMI 20.37 kg/m     General appearance: alert, cooperative and appears stated  age   Breasts: right - normal appearance, no masses or tenderness, No nipple retraction or dimpling, No nipple discharge or bleeding, No axillary adenopathy Left - normal appearance, 1 cm mass at 9:00, No nipple retraction or dimpling, No nipple discharge or bleeding, No axillary adenopathy   Pelvic: External genitalia:  no lesions              Urethra:  normal appearing urethra with no masses, tenderness or lesions              Bartholins and Skenes: normal                 Vagina: normal appearing vagina with normal color and discharge, no lesions              Cervix: no lesions.  IUD strings noted.                 Bimanual Exam:  Uterus:  normal size, contour, position, consistency, mobility, non-tender              Adnexa: no mass, fullness, tenderness              Chaperone was present for exam.  ASSESSMENT  STD screening.  Hx HSV I. Hx chlamydia.  Left breast mass.   PLAN  STD screening.  We discussed chlamydia and increased risk of ectopic pregnancy.  Information on safe sex to patient.  Proceed with left breast US.

## 2020-05-17 NOTE — Telephone Encounter (Signed)
[facility] for [facility] 1 cm lump at 9:00 position.   Patient agrees to imaging.

## 2020-05-17 NOTE — Telephone Encounter (Signed)
Message not needed. °

## 2020-05-18 LAB — HEP, RPR, HIV PANEL
HIV Screen 4th Generation wRfx: NONREACTIVE
Hepatitis B Surface Ag: NEGATIVE
RPR Ser Ql: NONREACTIVE

## 2020-05-18 LAB — CERVICOVAGINAL ANCILLARY ONLY
Chlamydia: NEGATIVE
Comment: NEGATIVE
Comment: NEGATIVE
Comment: NORMAL
Neisseria Gonorrhea: NEGATIVE
Trichomonas: NEGATIVE

## 2020-05-18 LAB — HSV 2 ANTIBODY, IGG: HSV 2 IgG, Type Spec: <0.91 index (ref 0.00–0.90)

## 2020-05-18 LAB — HEPATITIS C ANTIBODY: Hep C Virus Ab: 0.2 s/co ratio (ref 0.0–0.9)

## 2020-05-18 NOTE — Telephone Encounter (Signed)
MyChart message Last read by Michelene Gardener Hust at 6:48 AM on 05/18/2020.  Encounter closed.

## 2020-06-01 ENCOUNTER — Other Ambulatory Visit: Payer: Commercial Managed Care - PPO

## 2020-06-08 ENCOUNTER — Telehealth: Payer: Self-pay | Admitting: *Deleted

## 2020-06-08 NOTE — Telephone Encounter (Signed)
Left message to call Noreene Larsson, RN at [facility] [phone].   Patient is in [facility] hold for left breast US for left breast lump on exam during 01/27/20 AEX.  Per review of Epic, patient did not show for appt scheduled on 06/01/20.

## 2020-06-11 NOTE — Telephone Encounter (Signed)
Left detailed message, ok per dpr. Advised f/u with breast imaging ordered at last AEX with Dr. Edward Jolly. Please return call to the office at [phone] to further discuss.   MyChart message to patient.

## 2020-06-15 NOTE — Telephone Encounter (Signed)
Letter pended.  Printed letter to Dr. Edward Jolly to review and sign.   Dr. Edward Jolly -please review. OK to send letter and remove from MMG hold?

## 2020-06-16 NOTE — Telephone Encounter (Signed)
Letter reviewed and signed by Dr. Edward Jolly.  Letter mailed via standard [place] mail to address on file.  Removed from MMG hold.  Encounter closed.

## 2020-07-19 NOTE — Progress Notes (Signed)
GYNECOLOGY  VISIT  CC:   Here for STD screening Had a new partner 2 weeks ago (07/09/2020) Was treated for UTI with macrobid on Nov 21 x 5 days. She has some symptoms but hard to define. It is irritation but not itching like yeast. Denies bleeding pain or cramping. Discharge seems normal, but something is "off"  HPI: 24 y.o. G0P0000 Single White or Caucasian female here for std testing.   She is happy with Rutha Bouchard IUD  GYNECOLOGIC HISTORY: No LMP recorded. (Menstrual status: IUD). Contraception:kyleena iud inserted 11-13-19 Menopausal hormone therapy: none  Patient Active Problem List   Diagnosis Date Noted  . Fever blister 03/13/2019  . 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 Chlamydia 2021  . Depression   . Fever blister   . HSV-1 infection     Past Surgical History:  Procedure Laterality Date  . KIDNEY SURGERY  12/1999   repair of renal reflux    MEDS:   Current Outpatient Medications on File Prior to Visit  Medication Sig Dispense Refill  . levonorgestrel (KYLEENA) 19.5 MG IUD by Intrauterine route once.    . Multiple Vitamin (MULTIVITAMIN) tablet Take 1 tablet by mouth daily.    . valACYclovir (VALTREX) 500 MG tablet Take one tablet (500 mg) by mouth daily.  Take 4 tablets (2000 mg) by mouth every 12 hours for 24 hours for an outbreak. 100 tablet 3   No current facility-administered medications on file prior to visit.    ALLERGIES: Codeine  History reviewed. No pertinent family history.    Review of Systems  Constitutional: Negative.   HENT: Negative.   Eyes: Negative.   Respiratory: Negative.   Cardiovascular: Negative.   Gastrointestinal: Negative.   Endocrine: Negative.   Genitourinary:       Slight vaginal itching occ  Musculoskeletal: Negative.   Skin: Negative.   Allergic/Immunologic: Negative.   Neurological: Negative.   Hematological: Negative.   Psychiatric/Behavioral: Negative.     PHYSICAL EXAMINATION:     Wt 140 lb (63.5 kg)   BMI 21.44 kg/m     General appearance: alert, cooperative and appears stated age  Lymph:  Small inguinal LAD noted, Right groin  Pelvic: External genitalia:  no lesions              Urethra:  normal appearing urethra with no masses, tenderness or lesions              Bartholins and Skenes: normal                 Vagina: normal appearing vagina with large amount white discharge Kyleena IUD strings noted to be long ~6cm from os. Trimmed to about 3.5 cm              Cervix: no cervical motion tenderness and no lesions, non friable              Bimanual Exam:  Uterus:  normal size, contour, position, consistency, mobility, non-tender              Adnexa: no mass, fullness, tenderness      Chaperone, Cornelia Copa, CMA, was present for exam.  Assessment: STD screen IUD management, possible prolapsing IUD  Plan: NAAT GC/CT, AFFIRM sent to lab Pt declines HIV/Syphilis test, will get that done at annual exam in June 2022 Strings trimmed today Advised that IUD may be expelling, precautions given. Encouraged to use condoms F/U 2 weeks for re-evaluation of  IUD string. Pt may need removal and replacement.

## 2020-07-20 ENCOUNTER — Other Ambulatory Visit: Payer: Self-pay

## 2020-07-20 ENCOUNTER — Encounter: Payer: Self-pay | Admitting: Nurse Practitioner

## 2020-07-20 ENCOUNTER — Ambulatory Visit (INDEPENDENT_AMBULATORY_CARE_PROVIDER_SITE_OTHER): Payer: Commercial Managed Care - PPO | Admitting: Nurse Practitioner

## 2020-07-20 VITALS — BP 110/74 | HR 68 | Resp 16 | Wt 140.0 lb

## 2020-07-20 DIAGNOSIS — Z113 Encounter for screening for infections with a predominantly sexual mode of transmission: Secondary | ICD-10-CM

## 2020-07-20 DIAGNOSIS — Z30431 Encounter for routine checking of intrauterine contraceptive device: Secondary | ICD-10-CM

## 2020-07-20 NOTE — Patient Instructions (Signed)
Watch for bleeding, pain, cramping or signs that your body has expelled your IUD. Call office if you notice any changes.

## 2020-07-22 LAB — GC/CHLAMYDIA PROBE AMP
Chlamydia trachomatis, NAA: NEGATIVE
Neisseria Gonorrhoeae by PCR: NEGATIVE

## 2020-07-23 LAB — VAGINITIS/VAGINOSIS, DNA PROBE
Candida Species: NEGATIVE
Gardnerella vaginalis: NEGATIVE
Trichomonas vaginosis: NEGATIVE

## 2020-08-02 NOTE — Progress Notes (Deleted)
GYNECOLOGY  VISIT  CC:   ***  HPI: 24 y.o. G0P0000 Single White or Caucasian female here for 2 week follow up to check IUD string.     GYNECOLOGIC HISTORY: No LMP recorded. (Menstrual status: IUD). Contraception: kyleena iud inserted 11-13-2019 Menopausal hormone therapy: none  Patient Active Problem List   Diagnosis Date Noted  . Fever blister 03/13/2019  . 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 Chlamydia 2021  . Depression   . Fever blister   . HSV-1 infection     Past Surgical History:  Procedure Laterality Date  . KIDNEY SURGERY  12/1999   repair of renal reflux    MEDS:   Current Outpatient Medications on File Prior to Visit  Medication Sig Dispense Refill  . levonorgestrel (KYLEENA) 19.5 MG IUD by Intrauterine route once.    . Multiple Vitamin (MULTIVITAMIN) tablet Take 1 tablet by mouth daily.    . valACYclovir (VALTREX) 500 MG tablet Take one tablet (500 mg) by mouth daily.  Take 4 tablets (2000 mg) by mouth every 12 hours for 24 hours for an outbreak. 100 tablet 3   No current facility-administered medications on file prior to visit.    ALLERGIES: Codeine  No family history on file.  SH:  ***  Review of Systems  PHYSICAL EXAMINATION:    There were no vitals taken for this visit.    General appearance: alert, cooperative and appears stated age Neck: no adenopathy, supple, symmetrical, trachea midline and thyroid {CHL AMB PHY EX THYROID NORM DEFAULT:[phone]::"normal to inspection and palpation"} CV:  {Exam; heart brief:31539} Lungs:  {pe lungs ob:314451::"clear to auscultation, no wheezes, rales or rhonchi, symmetric air entry"} Breasts: {Exam; breast:13139::"normal appearance, no masses or tenderness"} Abdomen: soft, non-tender; bowel sounds normal; no masses,  no organomegaly Lymph:  no inguinal LAD noted  Pelvic: External genitalia:  no lesions              Urethra:  normal appearing urethra with no masses,  tenderness or lesions              Bartholins and Skenes: normal                 Vagina: normal appearing vagina with normal color and discharge, no lesions              Cervix: {CHL AMB PHY EX CERVIX NORM [phone]::"no lesions"}              Bimanual Exam:  Uterus:  {CHL AMB PHY EX UTERUS NORM DEFAULT:[phone]::"normal size, contour, position, consistency, mobility, non-tender"}              Adnexa: {CHL AMB PHY EX ADNEXA NO MASS DEFAULT:[phone]::"no mass, fullness, tenderness"}              Rectovaginal: {yes no:314532}.  Confirms.              Anus:  normal sphincter tone, no lesions  Chaperone, ***, CMA, was present for exam.  Assessment: ***  Plan: ***   {NUMBERS; -10-45 JOINT ROM:10287} minutes of total time was spent for this patient encounter, including preparation, face-to-face counseling with the patient and coordination of care, and documentation of the encounter.

## 2020-08-03 ENCOUNTER — Telehealth: Payer: Self-pay

## 2020-08-03 ENCOUNTER — Ambulatory Visit: Payer: Commercial Managed Care - PPO | Admitting: Nurse Practitioner

## 2020-08-03 ENCOUNTER — Encounter: Payer: Self-pay | Admitting: Nurse Practitioner

## 2020-08-03 NOTE — Telephone Encounter (Signed)
Left message for pt to return call to triage RN. 

## 2020-08-03 NOTE — Telephone Encounter (Signed)
Patient did not keep 2 week recheck appointment.

## 2020-08-09 NOTE — Telephone Encounter (Signed)
Left message for pt to return call to triage RN. 

## 2020-08-11 NOTE — Telephone Encounter (Signed)
Attempted to return call to pt x 3 with no answer or returned call.  Pt had [facility] appt on 08/03/20 Routing to Dr Edward Jolly,   [facility] to close encounter?

## 2020-08-11 NOTE — Telephone Encounter (Signed)
Left detailed message for pt to return call to reschedule OV.

## 2020-08-15 NOTE — Telephone Encounter (Signed)
Encounter reviewed and closed.  

## 2020-10-13 ENCOUNTER — Telehealth: Payer: Self-pay | Admitting: *Deleted

## 2020-10-13 NOTE — Telephone Encounter (Signed)
Patient called and left message stating her Rx for Valtrex 500 mg needs Prior authorization. Reports the pharmacy will give her 4 tablets. I called patient and left message for her to call to see if she is talking daily or just PRN.

## 2020-10-26 ENCOUNTER — Other Ambulatory Visit: Payer: Self-pay | Admitting: Obstetrics and Gynecology

## 2020-10-28 NOTE — Telephone Encounter (Signed)
Last AEX 01/27/2020

## 2021-01-26 NOTE — Progress Notes (Signed)
25 y.o. G0P0000 Single Caucasian female here for annual exam.    Patient had amenorrhea with [place] IUD. She was experiencing stress and weight loss at the time.   Her menses resumed in October or November when she returned to a healthy weight. Cycle occurs regularly but the flow is not consistent. Not having any significant cramping.  Likes the [employer].  Likes having her menstrual cycle.  New partner for 6 months and not sexually active.   She had a sexual partner since her last STD screening in November.   She declines blood work today but accepts vaginal/cervical testing.   Graduated from school.  She is interested in real estate.  PCP:   None  Patient's last menstrual period was 12/31/2020 (exact date).     Period Duration (Days):  (feels like 2 weeks) Period Pattern: Regular Menstrual Flow: Light Menstrual Control: Tampon Dysmenorrhea: (!) Mild Dysmenorrhea Symptoms: Cramping     Sexually active: No.  The current method of family planning is IUD.   Kyleena inserted 11-13-19 in [place] Exercising: Yes.     Gym 3 times a week Smoker:  no  Health Maintenance: Pap:  01-27-20 neg History of abnormal Pap:  no MMG:  none Colonoscopy:  none BMD:   none  Result  n/a TDaP:  2017 Gardasil:   no.   HIV: neg 2021 Hep C: neg 2021 Screening Labs:  declines.   reports that she has never smoked. She has never used smokeless tobacco. She reports current alcohol use. She reports that she does not use drugs.  Past Medical History:  Diagnosis Date   Chlamydia 2021   Depression    Fever blister    HSV-1 infection     Past Surgical History:  Procedure Laterality Date   INTRAUTERINE DEVICE (IUD) INSERTION     inserted 11-13-19   KIDNEY SURGERY  12/1999   repair of renal reflux    Current Outpatient Medications  Medication Sig Dispense Refill   levonorgestrel (KYLEENA) 19.5 MG IUD by Intrauterine route once.     Multiple Vitamin (MULTIVITAMIN) tablet Take 1  tablet by mouth daily.     valACYclovir (VALTREX) 500 MG tablet TAKE 1 TABLET BY MOUTH DAILY. TAKE 4 TABLETS BY MOUTH EVERY 12 HOURS FOR 24 HOURS FOR AN OUTBREAK 100 tablet 0   No current facility-administered medications for this visit.    History reviewed. No pertinent family history.  Review of Systems  Constitutional: Negative.   HENT: Negative.    Eyes: Negative.   Respiratory: Negative.    Cardiovascular: Negative.   Gastrointestinal: Negative.   Endocrine: Negative.   Genitourinary:        Having light long cycle with kyleena  Skin: Negative.   Allergic/Immunologic: Negative.   Neurological: Negative.   Hematological: Negative.   Psychiatric/Behavioral: Negative.     Exam:   BP 110/64   Pulse 71   Resp 16   Ht 5' 8.5" (1.74 m)   Wt 146 lb (66.2 kg)   LMP 12/31/2020 (Exact Date) Comment: kyleena iud inserted 11-13-19  BMI 21.88 kg/m     General appearance: alert, cooperative and appears stated age Head: normocephalic, without obvious abnormality, atraumatic Neck: no adenopathy, supple, symmetrical, trachea midline and thyroid normal to inspection and palpation Lungs: clear to auscultation bilaterally Breasts: right - normal appearance, no masses or tenderness, No nipple retraction or dimpling, No nipple discharge or bleeding, No axillary adenopathy Left with 1 cm mass at 9:00, nontender.  No nipple retraction  or dimpling, No nipple discharge or bleeding, No axillary adenopathy Heart: regular rate and rhythm Abdomen: soft, non-tender; no masses, no organomegaly Extremities: extremities normal, atraumatic, no cyanosis or edema Skin: skin color, texture, turgor normal. No rashes or lesions Lymph nodes: cervical, supraclavicular, and axillary nodes normal. Neurologic: grossly normal  Pelvic: External genitalia:  no lesions              No abnormal inguinal nodes palpated.              Urethra:  normal appearing urethra with no masses, tenderness or lesions               Bartholins and Skenes: normal                 Vagina: normal appearing vagina with normal color and discharge, no lesions              Cervix: no lesions.  IUD strings noted.               Pap taken: No. Bimanual Exam:  Uterus:  normal size, contour, position, consistency, mobility, non-tender              Adnexa: no mass, fullness, tenderness                Chaperone was present for exam.  Assessment:   Well woman visit with gynecologic exam. Persistent left breast mass.  No prior imaging. Kyleena IUD. HSV I.   Plan: Will order left breast ultrasound.  Self breast awareness reviewed. Pap in 2024. Guidelines for Calcium, Vitamin D, regular exercise program including cardiovascular and weight bearing exercise. Reassurance regarding IUD position and functionality.   Testing for GC/CT/trichomonas. She declines serum testing for STDs today but may choose to return at another time for this. Refill of Valtrex.  We dicussed Gardasil and she will consider this.  Follow up annually and prn.   After visit summary provided.

## 2021-01-27 ENCOUNTER — Telehealth: Payer: Self-pay | Admitting: Obstetrics and Gynecology

## 2021-01-27 ENCOUNTER — Other Ambulatory Visit: Payer: Self-pay

## 2021-01-27 ENCOUNTER — Other Ambulatory Visit (HOSPITAL_COMMUNITY)
Admission: RE | Admit: 2021-01-27 | Discharge: 2021-01-27 | Disposition: A | Discharge status: Home / Self Care | Payer: Commercial Managed Care - PPO | Source: Ambulatory Visit | Attending: Obstetrics and Gynecology | Admitting: Obstetrics and Gynecology

## 2021-01-27 ENCOUNTER — Encounter: Payer: Self-pay | Admitting: Obstetrics and Gynecology

## 2021-01-27 ENCOUNTER — Ambulatory Visit (INDEPENDENT_AMBULATORY_CARE_PROVIDER_SITE_OTHER): Payer: Commercial Managed Care - PPO | Admitting: Obstetrics and Gynecology

## 2021-01-27 ENCOUNTER — Ambulatory Visit: Payer: Commercial Managed Care - PPO | Admitting: Obstetrics and Gynecology

## 2021-01-27 VITALS — BP 110/64 | HR 71 | Resp 16 | Ht 68.5 in | Wt 146.0 lb

## 2021-01-27 DIAGNOSIS — N6320 Unspecified lump in the left breast, unspecified quadrant: Secondary | ICD-10-CM

## 2021-01-27 DIAGNOSIS — Z113 Encounter for screening for infections with a predominantly sexual mode of transmission: Secondary | ICD-10-CM | POA: Insufficient documentation

## 2021-01-27 DIAGNOSIS — Z01419 Encounter for gynecological examination (general) (routine) without abnormal findings: Secondary | ICD-10-CM | POA: Diagnosis not present

## 2021-01-27 MED ORDER — VALACYCLOVIR HCL 500 MG PO TABS: ORAL_TABLET | ORAL | 3 refills | Status: DC

## 2021-01-27 NOTE — Addendum Note (Signed)
Addended by: Ardell Isaacs, Debbe Bales E on: 01/27/2021 02:53 PM   Modules accepted: Orders

## 2021-01-27 NOTE — Telephone Encounter (Signed)
Please schedule a left breast US for [facility] patient [facility].  She has a persistent 1 cm left breast mass at 9:00 and she has not done any imaging to date.

## 2021-01-27 NOTE — Patient Instructions (Signed)

## 2021-01-28 LAB — CERVICOVAGINAL ANCILLARY ONLY
Chlamydia: NEGATIVE
Comment: NEGATIVE
Comment: NEGATIVE
Comment: NORMAL
Neisseria Gonorrhea: NEGATIVE
Trichomonas: NEGATIVE

## 2021-01-28 NOTE — Telephone Encounter (Signed)
Patient scheduled at breast center on 02/18/21 @7 :15am, left message for patient to call.

## 2021-02-01 NOTE — Telephone Encounter (Signed)
Left message for patient to call.

## 2021-02-09 NOTE — Telephone Encounter (Signed)
Please place in mammogram hold and then close this encounter.  

## 2021-02-15 NOTE — Telephone Encounter (Signed)
Patient placed in MMG hold.   Encounter closed.  

## 2021-02-18 ENCOUNTER — Ambulatory Visit
Admission: RE | Admit: 2021-02-18 | Discharge: 2021-02-18 | Disposition: A | Discharge status: Home / Self Care | Payer: Commercial Managed Care - PPO | Source: Ambulatory Visit | Attending: Obstetrics and Gynecology | Admitting: Obstetrics and Gynecology

## 2021-02-18 ENCOUNTER — Other Ambulatory Visit: Payer: Self-pay | Admitting: Obstetrics and Gynecology

## 2021-02-18 ENCOUNTER — Other Ambulatory Visit: Payer: Self-pay

## 2021-02-18 DIAGNOSIS — N6320 Unspecified lump in the left breast, unspecified quadrant: Secondary | ICD-10-CM

## 2021-03-15 ENCOUNTER — Encounter: Payer: Self-pay | Admitting: Obstetrics and Gynecology

## 2021-03-16 ENCOUNTER — Telehealth: Payer: Self-pay

## 2021-03-16 NOTE — Telephone Encounter (Signed)
See My Chart message dated today. I called the pharmacy to understand what happened with Valtrex Rx.  Pharmacy said last fill in April her ins co let them fill #96 and it left #4 on that old Rx and she must have used the old Rx # to request current refill and that is why she received only #4.  The pharmacy had the new June Rx and ran it through and her ins co will allow them to fill it now for her. They will get it ready. I called patient and explained all of this to her.

## 2021-08-23 ENCOUNTER — Other Ambulatory Visit: Payer: Self-pay | Admitting: Obstetrics and Gynecology

## 2021-08-23 ENCOUNTER — Ambulatory Visit
Admission: RE | Admit: 2021-08-23 | Discharge: 2021-08-23 | Disposition: A | Discharge status: Home / Self Care | Payer: BC Managed Care – PPO | Source: Ambulatory Visit | Attending: Obstetrics and Gynecology | Admitting: Obstetrics and Gynecology

## 2021-08-23 DIAGNOSIS — N6320 Unspecified lump in the left breast, unspecified quadrant: Secondary | ICD-10-CM

## 2021-08-23 DIAGNOSIS — N6322 Unspecified lump in the left breast, upper inner quadrant: Secondary | ICD-10-CM | POA: Diagnosis not present

## 2021-08-23 DIAGNOSIS — N6324 Unspecified lump in the left breast, lower inner quadrant: Secondary | ICD-10-CM | POA: Diagnosis not present

## 2021-09-01 IMAGING — US US BREAST*L* LIMITED INC AXILLA
1 series · 7 of 7 positions shown · non-contrast
Comparison: None.

CLINICAL DATA: Patient reports a palpable lump along the medial
aspect of the left breast, which she has noticed for over 2 years,
and which has waxed and waned with her menstrual cycle. Two does not
believe that it has increased in size over the last 2 years. There
is no associated pain/tenderness.

EXAM:
ULTRASOUND OF THE LEFT BREAST

[Series 1: us breast*left* limited inc axilla · 0.06mm/px · 7 of 7 slices shown]
[im 1/7]
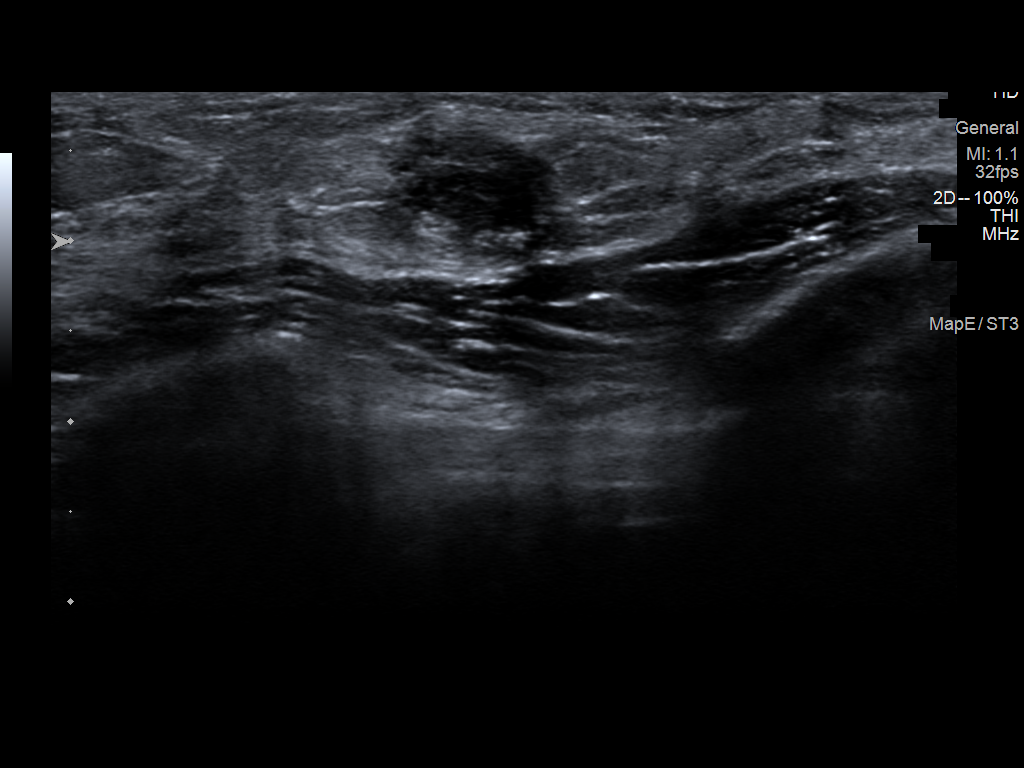
[im 2/7]
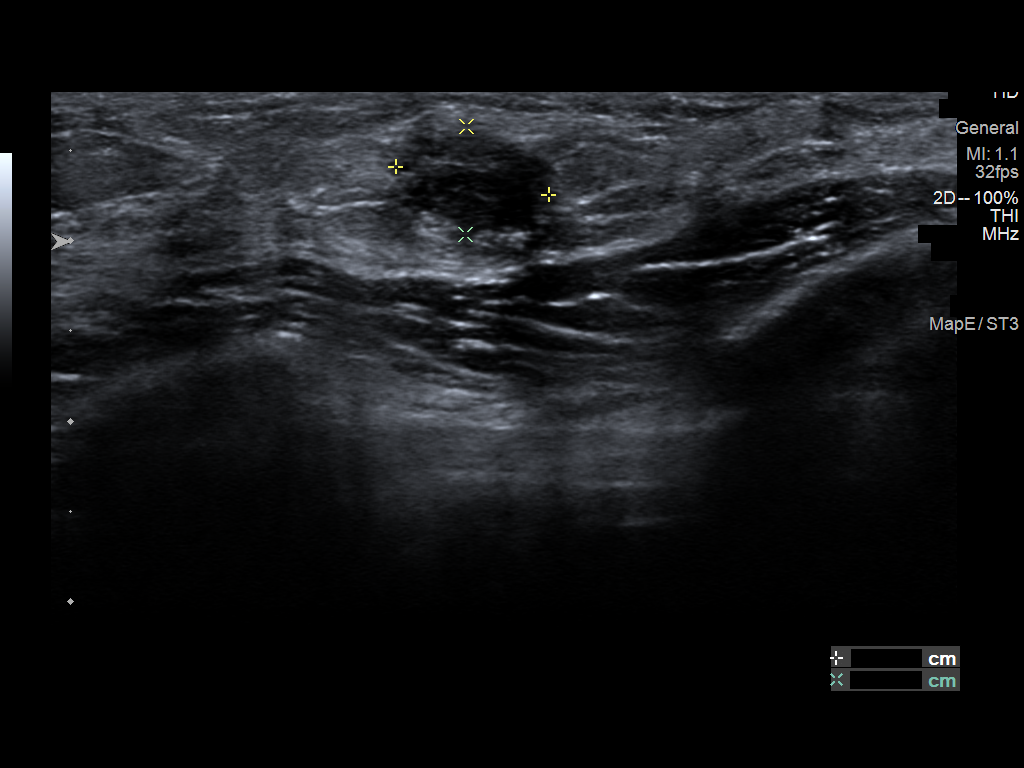
[im 3/7]
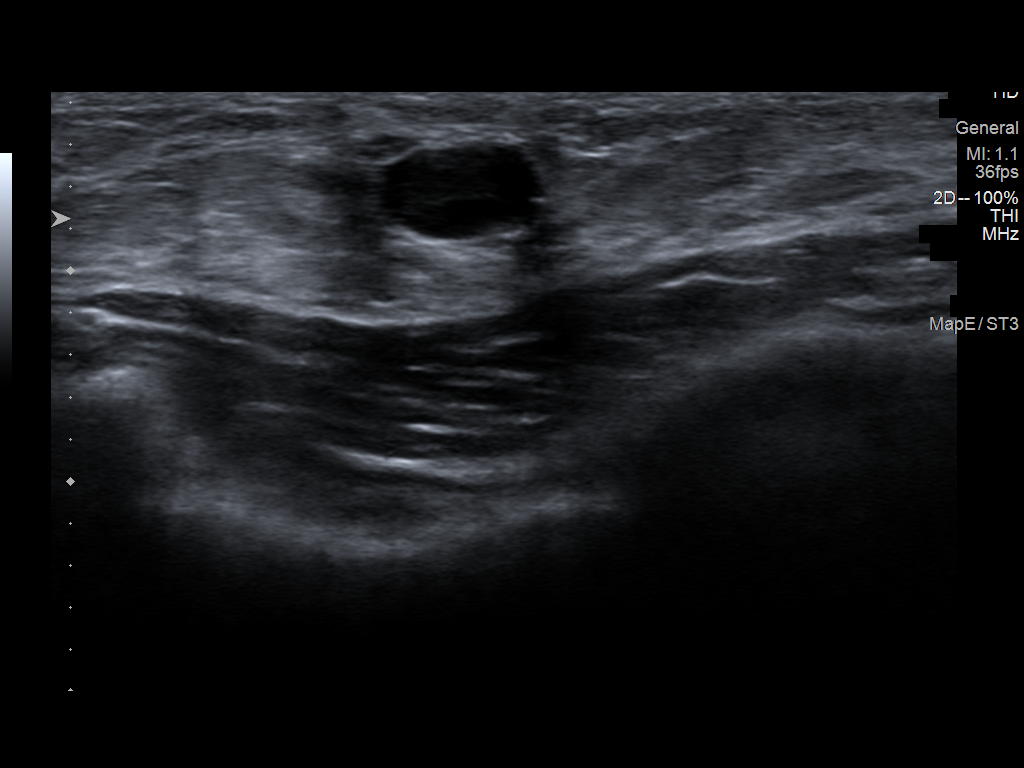
[im 4/7]
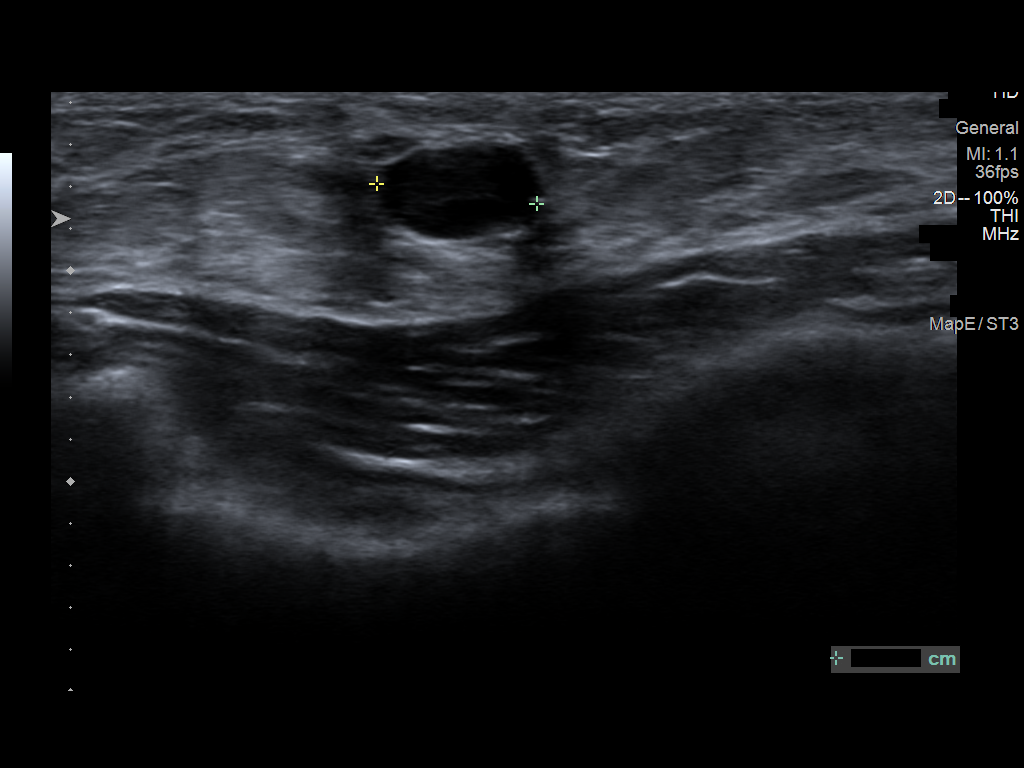
[im 5/7]
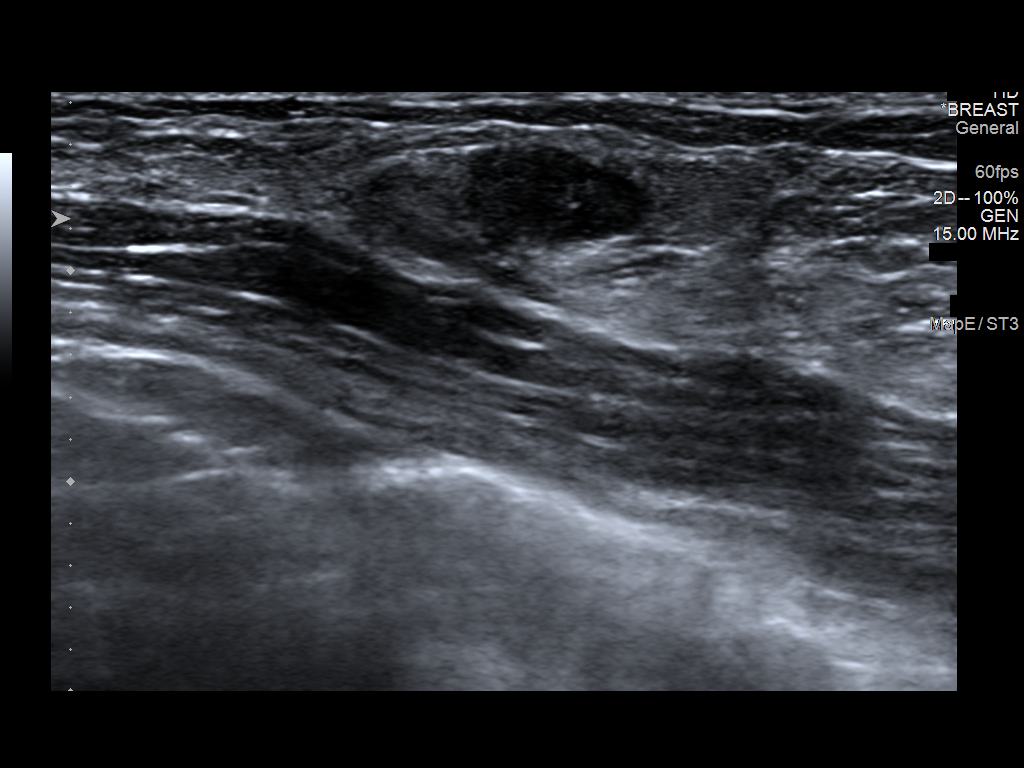
[im 6/7]
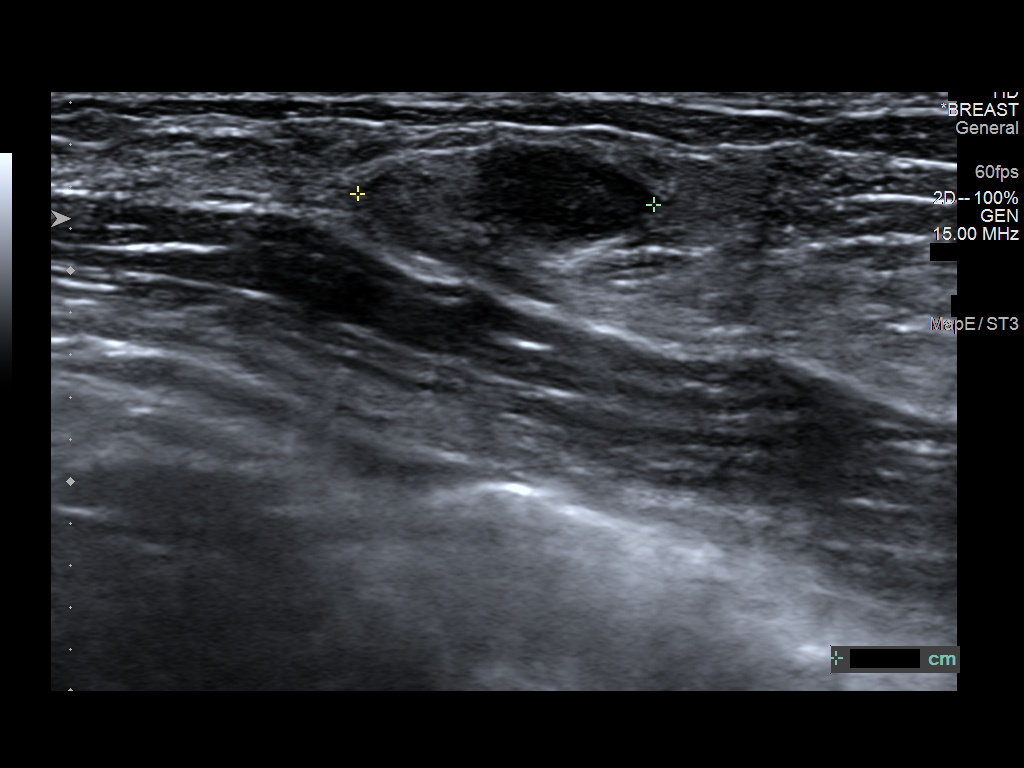
[im 7/7]
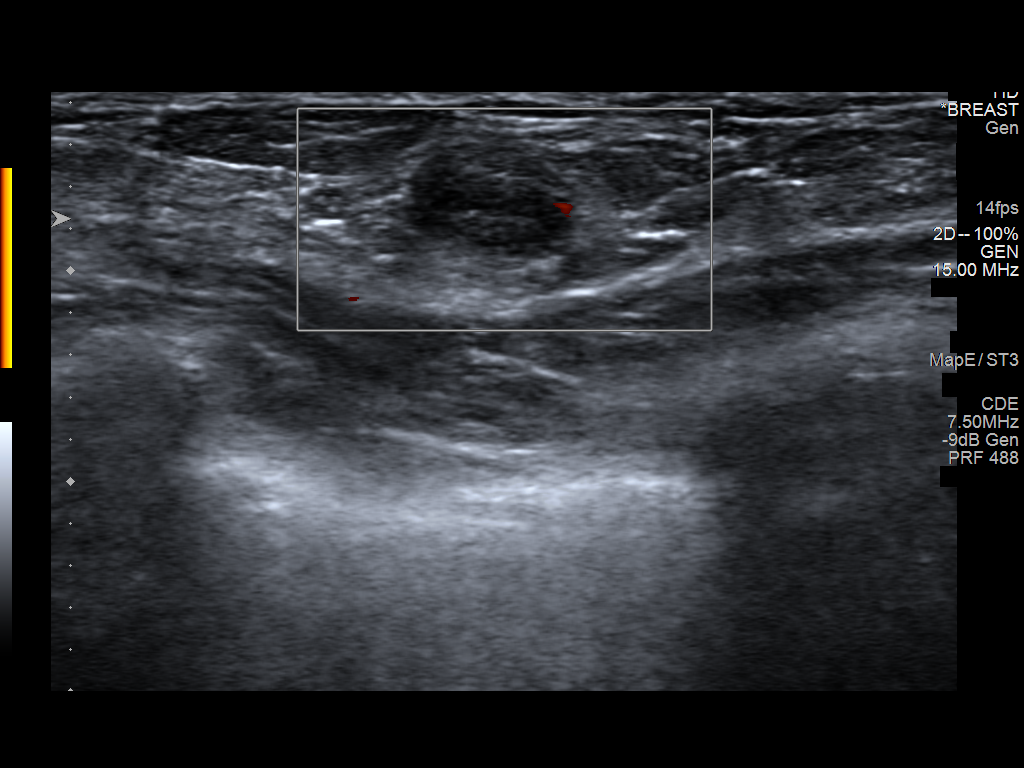

[7 of 7 positions shown; findings below may reference images not displayed]

FINDINGS: On physical exam, there is no definite palpable mass an the medial
left breast.

Targeted ultrasound is performed, showing a heterogeneous mass in
the left breast at 9 o'clock, 5 cm from the nipple, measuring 1.4 x
0.6 x 0.9 cm in size. There is some internal blood flow on color
Doppler analysis. Mass is oriented parallel, with partly
circumscribed and partly ill-defined margins. The more medial
component appears cystic.
IMPRESSION: 1. Heterogeneous mass in the left breast at 9 o'clock, 1.4 cm in
long axis. Based on the history this mass being stable to palpation
for 2 years and waxing and waning with the patient's menstrual
cycle, this is felt to be a probably benign mass, with short-term
follow-up recommended.

RECOMMENDATION:
Left breast ultrasound in 6 months to reassess the 9 o'clock
position mass.

I have discussed the findings and recommendations with the patient.
If applicable, a reminder letter will be sent to the patient
regarding the next appointment.

BI-RADS CATEGORY  3: Probably benign.

## 2021-10-07 ENCOUNTER — Other Ambulatory Visit: Payer: Self-pay

## 2021-10-07 MED ORDER — VALACYCLOVIR HCL 500 MG PO TABS: ORAL_TABLET | ORAL | 0 refills | Status: DC

## 2021-10-07 NOTE — Telephone Encounter (Signed)
Pt calling to request refill of valtrex.  Last AEX 01/27/21.

## 2022-03-01 ENCOUNTER — Other Ambulatory Visit: Payer: BC Managed Care – PPO

## 2022-03-06 IMAGING — US US BREAST*L* LIMITED INC AXILLA
1 series · 13 of 13 positions shown · non-contrast
Comparison: Previous exam(s).

CLINICAL DATA: Short-term follow-up for a likely benign left breast
mass.

EXAM:
ULTRASOUND OF THE LEFT BREAST

[Series 1: us breast*left* limited inc axilla · 0.06mm/px · 13 of 13 slices shown]
[im 1/13]
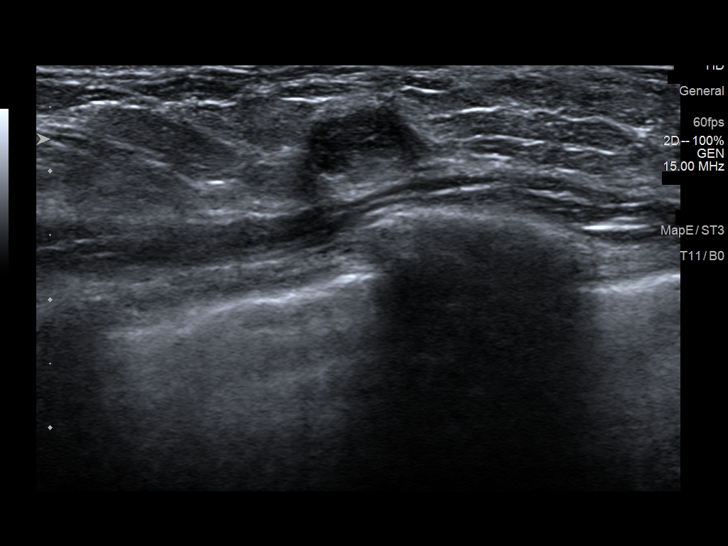
[im 2/13]
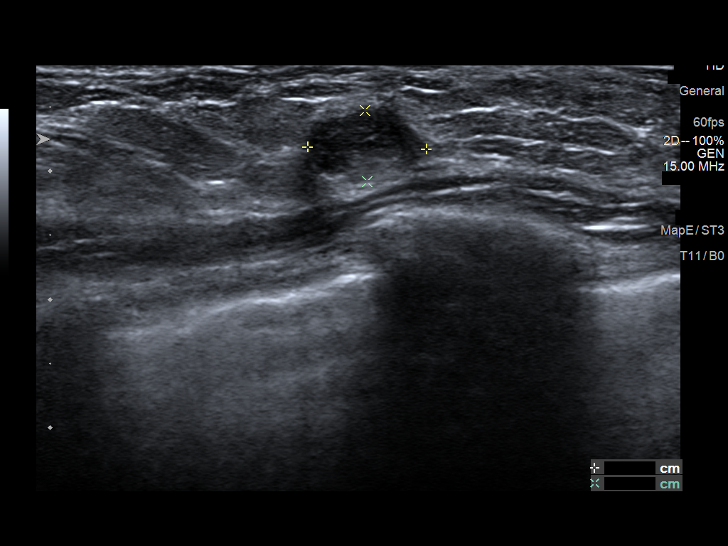
[im 3/13]
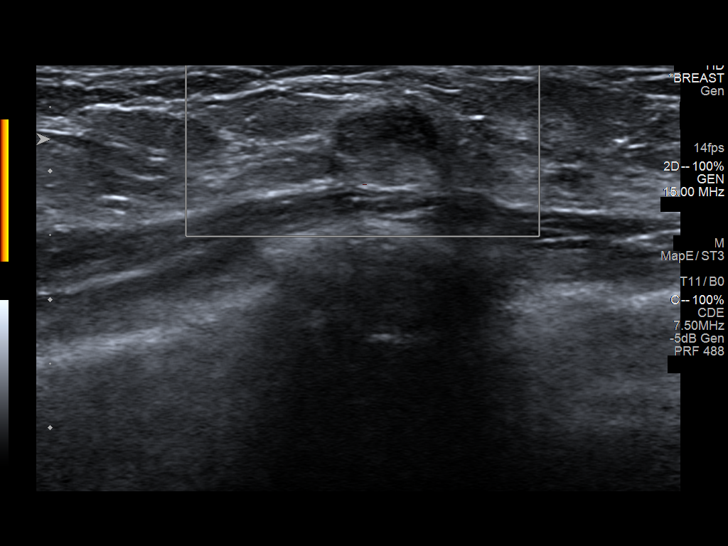
[im 4/13]
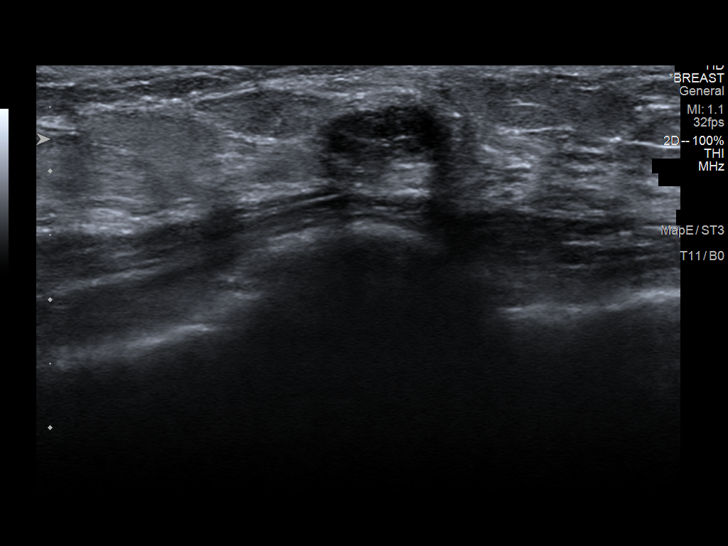
[im 5/13]
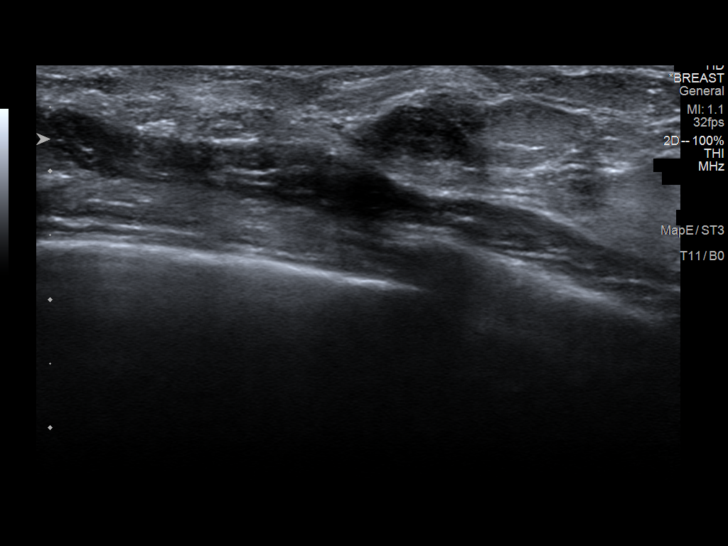
[im 6/13]
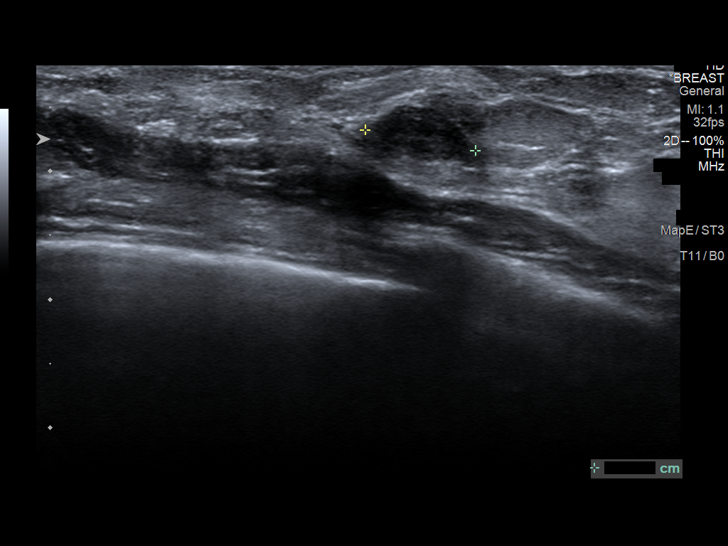
[im 7/13]
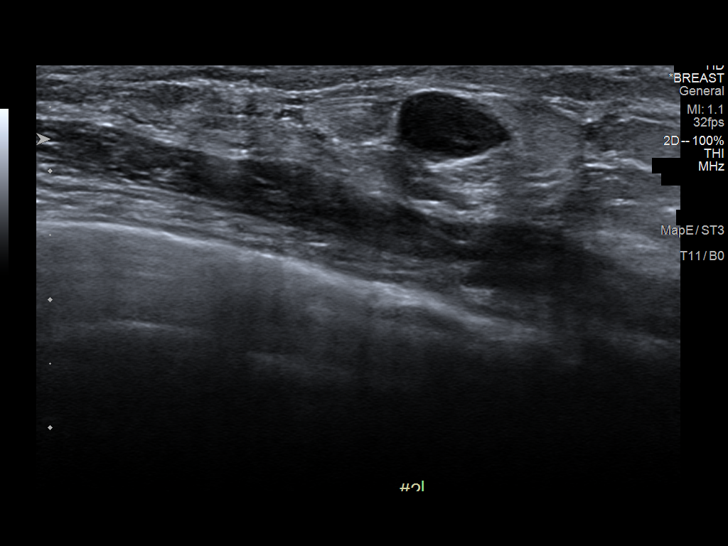
[im 8/13]
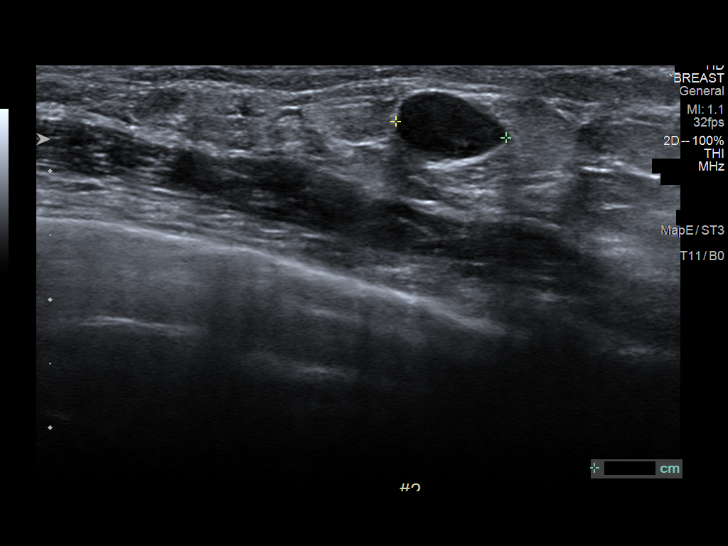
[im 9/13]
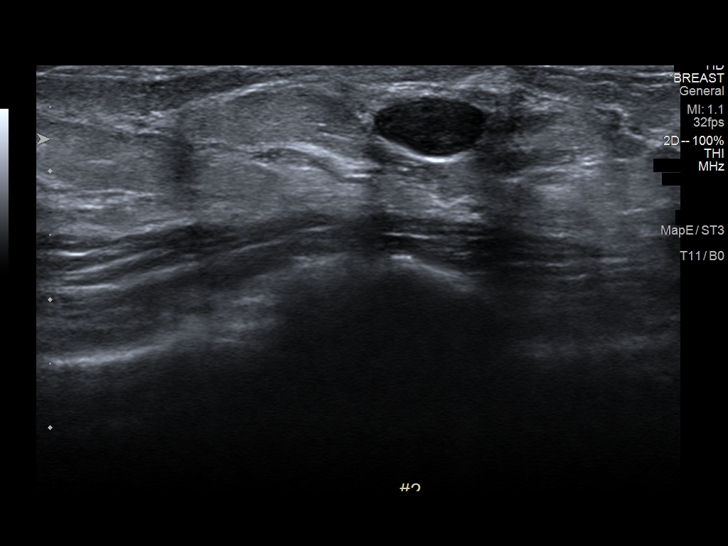
[im 10/13]
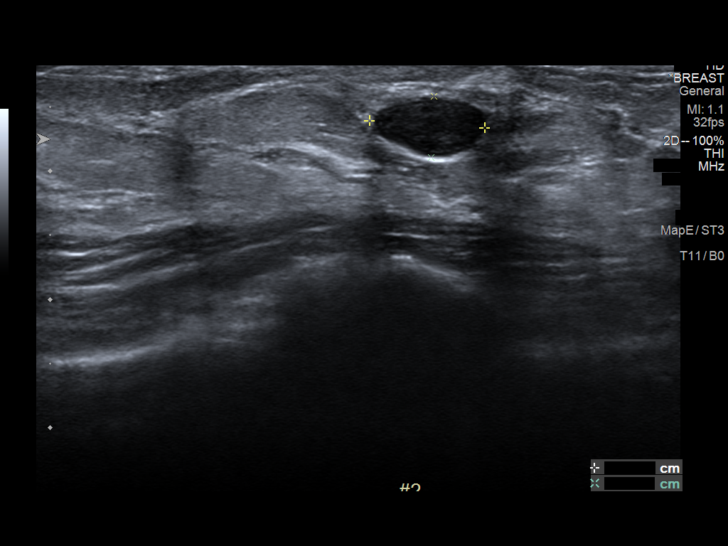
[im 11/13]
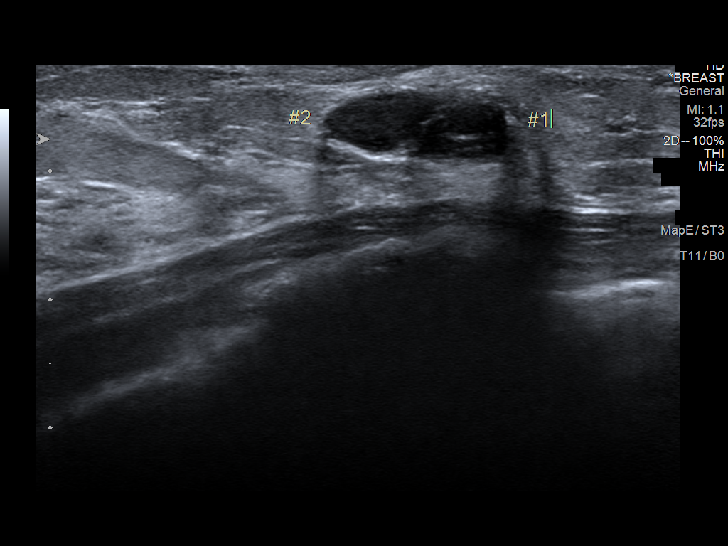
[im 12/13]
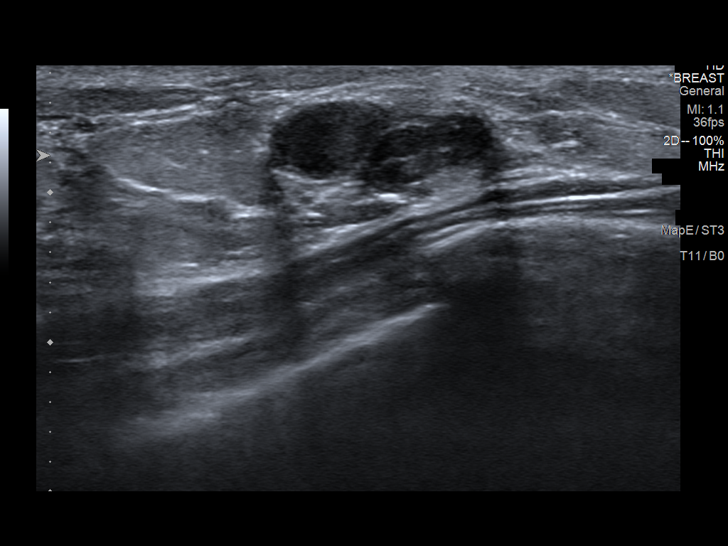
[im 13/13]
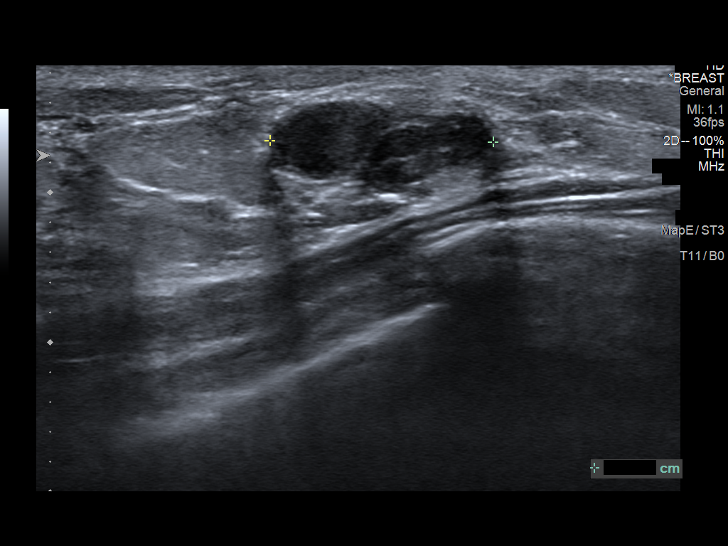

[13 of 13 positions shown; findings below may reference images not displayed]

FINDINGS: Ultrasound of the left breast at 9 o'clock, 5 cm from the nipple
demonstrates 2 masses which abut in the left breast at 9 o'clock, 5
cm from the nipple. The first mass measures 0.9 x 0.6 x 0.9 cm,
previously measuring 0.9 x 0.6 cm in the anti radial dimension. The
second mass measures 0.9 x 0.5 x 0.9 cm, previously 0.8 x 0.5 cm in
the anti radial dimension. The 2 masses together measured in an
oblique plane measures 1.5 cm, previously 1.4 cm.
IMPRESSION: Two likely benign abutting masses in the left breast at 9 o'clock
are stable.

RECOMMENDATION:
Six-month follow-up left breast ultrasound.

I have discussed the findings and recommendations with the patient.
If applicable, a reminder letter will be sent to the patient
regarding the next appointment.

BI-RADS CATEGORY  3: Probably benign.

## 2022-03-19 ENCOUNTER — Other Ambulatory Visit: Payer: Self-pay | Admitting: Obstetrics and Gynecology

## 2022-03-19 ENCOUNTER — Telehealth: Payer: Self-pay | Admitting: Family

## 2022-03-19 DIAGNOSIS — B3731 Acute candidiasis of vulva and vagina: Secondary | ICD-10-CM

## 2022-03-19 MED ORDER — FLUCONAZOLE 150 MG PO TABS: 150.0000 mg | ORAL_TABLET | ORAL | 0 refills | Status: DC | PRN

## 2022-03-19 NOTE — Progress Notes (Signed)

## 2022-03-20 MED ORDER — VALACYCLOVIR HCL 500 MG PO TABS: ORAL_TABLET | ORAL | 2 refills | Status: DC

## 2022-03-20 NOTE — Telephone Encounter (Signed)
Message from patient "Patient comment: Running low- wedding coming up and would love to avoid a flare up on wedding day."  AEX 01/27/2021 No AEX scheduled but she has a follow up IUD check appt scheduled 03/24/2022. Patient is self pay.

## 2022-03-22 NOTE — Progress Notes (Signed)
GYNECOLOGY  VISIT   HPI: 26 y.o.   Single  Caucasian  female   G0P0000 with Patient's last menstrual period was 03/21/2022.   here for IUD check. Patient getting married in 3 weeks and just wants IUD to be checked.    She was waiting to schedule AEX when she had insurance.  Patient would like STD testing and testing for vaginitis.  May have yeast infection also--she took 2 Diflucan this week with an e visit.  Had cottage cheese discharge, inflammation, and itching. Not sure if her symptoms have completely resolved because she is on her period.   GYNECOLOGIC HISTORY: Patient's last menstrual period was 03/21/2022. 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     Multiple Vitamin (MULTIVITAMIN) tablet Take 1 tablet by mouth daily.     valACYclovir (VALTREX) 500 MG tablet TAKE 1 TABLET BY MOUTH DAILY. TAKE 4 TABLETS BY MOUTH EVERY 12 HOURS FOR 24 HOURS FOR AN OUTBREAK 100 tablet 2   No current facility-administered medications for this visit.     ALLERGIES: Codeine  History reviewed. No pertinent family history.  Social History   Socioeconomic History    Marital status: Single    Spouse name: Not on file   Number of children: Not on file   Years of education: Not on file   Highest education level: Not on file  Occupational History   Not on file  Tobacco Use   Smoking status: Never   Smokeless tobacco: Never  Vaping Use   Vaping Use: Never used  Substance and Sexual Activity   Alcohol use: Yes    Comment: 1 a month   Drug use: Never   Sexual activity: Not Currently    Birth control/protection: I.U.D.    Comment: Kyleena IUD 11-13-19  Other Topics Concern   Not on file  Social History Narrative   Not on file   Social Determinants of Health   Financial Resource Strain: Not on file  Food Insecurity: Not on file  Transportation Needs: Not on file  Physical Activity: Not on file  Stress: Not on file  Social Connections: Not on file  Intimate Partner Violence: Not on file    Review of Systems  Genitourinary:  Positive for vaginal pain (slight vaginal irritation--took Diflucan this week).  All  other systems reviewed and are negative.   PHYSICAL EXAMINATION:    BP 110/68   Ht 5' 8.5" (1.74 m)   Wt 146 lb (66.2 kg)   LMP 03/21/2022   BMI 21.88 kg/m     General appearance: alert, cooperative and appears stated age   Pelvic: External genitalia:  no lesions              Urethra:  normal appearing urethra with no masses, tenderness or lesions              Bartholins and Skenes: normal                 Vagina: normal appearing vagina with normal color and yellow mucousy discharge, no lesions              Cervix: no lesions.  IUD strings noted.                  Bimanual Exam:  Uterus:  normal size, contour, position, consistency, mobility, non-tender              Adnexa: no mass, fullness, tenderness            Chaperone was present for exam:  Marchelle Folks, CMA  ASSESSMENT  Kyleena IUD check up. STD screening.  Vaginitis testing.  Recent probably yeast treated with Diflucan by e visit.   PLAN  Reassurance given regarding  IUD presence and good pregnancy prevention.  STD screening.  Vaginitis testing.  She will schedule her annual exam for September.   An After Visit Summary was printed and given to the patient.  21 min  total time was spent for this patient encounter, including preparation, face-to-face counseling with the patient, coordination of care, and documentation of the encounter.

## 2022-03-24 ENCOUNTER — Other Ambulatory Visit (HOSPITAL_COMMUNITY)
Admission: RE | Admit: 2022-03-24 | Discharge: 2022-03-24 | Disposition: A | Discharge status: Home / Self Care | Payer: Commercial Managed Care - PPO | Source: Ambulatory Visit | Attending: Obstetrics and Gynecology | Admitting: Obstetrics and Gynecology

## 2022-03-24 ENCOUNTER — Encounter: Payer: Self-pay | Admitting: Obstetrics and Gynecology

## 2022-03-24 ENCOUNTER — Ambulatory Visit (INDEPENDENT_AMBULATORY_CARE_PROVIDER_SITE_OTHER): Payer: Commercial Managed Care - PPO | Admitting: Obstetrics and Gynecology

## 2022-03-24 VITALS — BP 110/68 | Ht 68.5 in | Wt 146.0 lb

## 2022-03-24 DIAGNOSIS — Z114 Encounter for screening for human immunodeficiency virus [HIV]: Secondary | ICD-10-CM

## 2022-03-24 DIAGNOSIS — Z30431 Encounter for routine checking of intrauterine contraceptive device: Secondary | ICD-10-CM

## 2022-03-24 DIAGNOSIS — N760 Acute vaginitis: Secondary | ICD-10-CM | POA: Insufficient documentation

## 2022-03-24 DIAGNOSIS — Z1159 Encounter for screening for other viral diseases: Secondary | ICD-10-CM

## 2022-03-24 DIAGNOSIS — Z113 Encounter for screening for infections with a predominantly sexual mode of transmission: Secondary | ICD-10-CM | POA: Diagnosis present

## 2022-03-24 NOTE — Addendum Note (Signed)
Addended by: Ardell Isaacs, Debbe Bales E on: 03/24/2022 02:22 PM   Modules accepted: Orders

## 2022-03-26 LAB — RPR: RPR Ser Ql: NONREACTIVE

## 2022-03-26 LAB — HEPATITIS C ANTIBODY: Hepatitis C Ab: NONREACTIVE

## 2022-03-26 LAB — HIV ANTIBODY (ROUTINE TESTING W REFLEX): HIV 1&2 Ab, 4th Generation: NONREACTIVE

## 2022-03-27 LAB — CERVICOVAGINAL ANCILLARY ONLY
Bacterial Vaginitis (gardnerella): POSITIVE — AB
Candida Glabrata: NEGATIVE
Candida Vaginitis: NEGATIVE
Chlamydia: NEGATIVE
Comment: NEGATIVE
Comment: NEGATIVE
Comment: NEGATIVE
Comment: NEGATIVE
Comment: NEGATIVE
Comment: NORMAL
Neisseria Gonorrhea: NEGATIVE
Trichomonas: NEGATIVE

## 2022-03-28 ENCOUNTER — Other Ambulatory Visit: Payer: Self-pay | Admitting: *Deleted

## 2022-03-28 MED ORDER — METRONIDAZOLE 500 MG PO TABS: 500.0000 mg | ORAL_TABLET | Freq: Two times a day (BID) | ORAL | 0 refills | Status: DC

## 2022-04-22 ENCOUNTER — Telehealth: Payer: BC Managed Care – PPO | Admitting: Family Medicine

## 2022-04-22 DIAGNOSIS — N3000 Acute cystitis without hematuria: Secondary | ICD-10-CM | POA: Diagnosis not present

## 2022-04-22 MED ORDER — CEPHALEXIN 500 MG PO CAPS: 500.0000 mg | ORAL_CAPSULE | Freq: Two times a day (BID) | ORAL | 0 refills | Status: AC

## 2022-04-22 MED ORDER — CEPHALEXIN 500 MG PO CAPS: 500.0000 mg | ORAL_CAPSULE | Freq: Two times a day (BID) | ORAL | 0 refills | Status: DC

## 2022-04-22 NOTE — Addendum Note (Signed)
Addended by: Georgana Curio on: 04/22/2022 07:44 PM   Modules accepted: Orders

## 2022-04-22 NOTE — Addendum Note (Signed)
Addended by: Georgana Curio on: 04/22/2022 06:51 PM   Modules accepted: Orders

## 2022-04-22 NOTE — Progress Notes (Signed)

## 2022-04-27 NOTE — Progress Notes (Signed)
26 y.o. G0P0000 Single Caucasian female here for annual exam.    Treated for BV in August.  Had negative STD screening.   Treated recently for a UTI.  Treated with Keflex.  Feeling better now.   Followed for two breast masses.  She is due for her breast US follow up.  Menses short and light on her IUD.   No immediate plans for starting a family.   Just got married.  Went to [place] for a honeymoon.  PCP:  None  Patient's last menstrual period was 04/20/2022 (exact date).     Period Cycle (Days): 30 Period Duration (Days): 3 Period Pattern: Regular Menstrual Flow: Light Menstrual Control: Tampon Dysmenorrhea: None     Sexually active: Yes.    The current method of family planning is IUD--Kyleena 11-13-19 in [place].    Exercising: No.   walking Smoker:  no  Health Maintenance: Pap:   01-27-20 neg History of abnormal Pap:  no MMG:  08-23-21 Lt.Br.U/S--Two likely benign abutting masses in the left breast at 9 o'clock are stable./6 mo.f/u//BiRads3/probably benign. APPT. 05-01-22 Colonoscopy:  n/a BMD:   n/a  Result  n/a TDaP:  2017 Gardasil:   no HIV:03-24-22 NR Hep C:03-24-22 Neg Screening Labs:  she will return for labs   reports that she has never smoked. She has never used smokeless tobacco. She reports current alcohol use of about 2.0 standard drinks of alcohol per week. She reports that she does not use drugs.  Past Medical History:  Diagnosis Date   Chlamydia 2021   Depression    Fever blister    HSV-1 infection     Past Surgical History:  Procedure Laterality Date   INTRAUTERINE DEVICE (IUD) INSERTION     inserted 11-13-19   KIDNEY SURGERY  12/1999   repair of renal reflux    Current Outpatient Medications  Medication Sig Dispense Refill   cephALEXin (KEFLEX) 500 MG capsule Take 1 capsule (500 mg total) by mouth 2 (two) times daily for 7 days. 14 capsule 0   L-Lysine 1000 MG TABS Take by mouth.     levonorgestrel (KYLEENA) 19.5 MG IUD by Intrauterine  route once.     Multiple Vitamin (MULTIVITAMIN) tablet Take 1 tablet by mouth daily.     valACYclovir (VALTREX) 500 MG tablet TAKE 1 TABLET BY MOUTH DAILY. TAKE 4 TABLETS BY MOUTH EVERY 12 HOURS FOR 24 HOURS FOR AN OUTBREAK 100 tablet 2   No current facility-administered medications for this visit.    History reviewed. No pertinent family history.  Review of Systems  All other systems reviewed and are negative.   Exam:   BP 100/64   Pulse 76   Ht 5' 8.5" (1.74 m)   Wt 146 lb (66.2 kg)   LMP 04/20/2022 (Exact Date)   SpO2 99%   BMI 21.88 kg/m     General appearance: alert, cooperative and appears stated age Head: normocephalic, without obvious abnormality, atraumatic Neck: no adenopathy, supple, symmetrical, trachea midline and thyroid normal to inspection and palpation Lungs: clear to auscultation bilaterally Breasts: right - normal appearance, no masses or tenderness, No nipple retraction or dimpling, No nipple discharge or bleeding, No axillary adenopathy Left - normal appearance, 1.5 cm nontender mobile mass at 8:00, No nipple retraction or dimpling, No nipple discharge or bleeding, No axillary adenopathy Heart: regular rate and rhythm Abdomen: soft, non-tender; no masses, no organomegaly Extremities: extremities normal, atraumatic, no cyanosis or edema Skin: skin color, texture, turgor normal. No rashes or  lesions Lymph nodes: cervical, supraclavicular, and axillary nodes normal. Neurologic: grossly normal  Pelvic: External genitalia:  no lesions              No abnormal inguinal nodes palpated.              Urethra:  normal appearing urethra with no masses, tenderness or lesions              Bartholins and Skenes: normal                 Vagina: normal appearing vagina with normal color and discharge, no lesions              Cervix: no lesions.  IUD strings noted.               Pap taken: no Bimanual Exam:  Uterus:  normal size, contour, position, consistency, mobility,  non-tender              Adnexa: no mass, fullness, tenderness      Chaperone was present for exam: Marchelle Folks, CMA  Assessment:   Well woman visit with gynecologic exam. Kyleena IUD.  Hx HSV I.  Left breast masses.   Plan: Mammogram screening age 57. Self breast awareness reviewed. She has a left breast US scheduled for next week.  Pap 2024. Has Rx for Valtrex.  Start Gardasil vaccine.  Guidelines for Calcium, Vitamin D, regular exercise program including cardiovascular and weight bearing exercise. She will return for routine blood work when she does her Gardasil vaccine, #2 in two months.  Follow up annually and prn.   After visit summary provided.

## 2022-04-28 ENCOUNTER — Ambulatory Visit (INDEPENDENT_AMBULATORY_CARE_PROVIDER_SITE_OTHER): Payer: BC Managed Care – PPO | Admitting: Obstetrics and Gynecology

## 2022-04-28 ENCOUNTER — Encounter: Payer: Self-pay | Admitting: Obstetrics and Gynecology

## 2022-04-28 VITALS — BP 100/64 | HR 76 | Ht 68.5 in | Wt 146.0 lb

## 2022-04-28 DIAGNOSIS — Z01419 Encounter for gynecological examination (general) (routine) without abnormal findings: Secondary | ICD-10-CM

## 2022-04-28 DIAGNOSIS — Z23 Encounter for immunization: Secondary | ICD-10-CM

## 2022-04-28 DIAGNOSIS — Z0000 Encounter for general adult medical examination without abnormal findings: Secondary | ICD-10-CM

## 2022-04-28 NOTE — Patient Instructions (Signed)

## 2022-05-01 ENCOUNTER — Ambulatory Visit
Admission: RE | Admit: 2022-05-01 | Discharge: 2022-05-01 | Disposition: A | Discharge status: Home / Self Care | Payer: Self-pay | Source: Ambulatory Visit | Attending: Obstetrics and Gynecology | Admitting: Obstetrics and Gynecology

## 2022-05-01 DIAGNOSIS — N6320 Unspecified lump in the left breast, unspecified quadrant: Secondary | ICD-10-CM

## 2022-05-17 ENCOUNTER — Telehealth: Payer: Commercial Managed Care - PPO | Admitting: Physician Assistant

## 2022-05-17 DIAGNOSIS — N3000 Acute cystitis without hematuria: Secondary | ICD-10-CM | POA: Diagnosis not present

## 2022-05-17 DIAGNOSIS — N390 Urinary tract infection, site not specified: Secondary | ICD-10-CM

## 2022-05-17 NOTE — Progress Notes (Signed)
Because you have been seen within past month via e-visit for UTI and concern for antibiotic resistance and need for urinalysis and culture, I feel your condition warrants further evaluation and I recommend that you be seen in a face to face visit.   NOTE: There will be NO CHARGE for this eVisit   If you are having a true medical emergency please call 911.      For an urgent face to face visit, [facility] has seven urgent care centers for your convenience:     [facility] [facility] [place] Get Driving Directions [phone] [address], [place] [address]    [facility]) Get Driving Directions [phone] [place], [place] [address]  [facility] [place] ([place]) Get Driving Directions [phone] [address] [place],  [place]  [address]  [facility] at [address] Get Driving Directions  [phone] [address] [place],  [place] [address]   [facility] Get Driving Directions [phone] [address], [place], [place] [address]   [facility] Get Driving Directions  [phone] [address], [place] [address]   [facility] [place] Get Driving Directions [phone] [address] [place], [place] [address]  Your MyChart E-visit questionnaire answers were reviewed by a board certified advanced clinical practitioner to complete your personal care plan based on your specific symptoms.  Thank you for using e-Visits.

## 2022-05-18 ENCOUNTER — Ambulatory Visit
Admission: RE | Admit: 2022-05-18 | Discharge: 2022-05-18 | Disposition: A | Discharge status: Home / Self Care | Payer: BC Managed Care – PPO | Source: Ambulatory Visit | Attending: Obstetrics and Gynecology | Admitting: Obstetrics and Gynecology

## 2022-05-18 ENCOUNTER — Other Ambulatory Visit: Payer: Self-pay | Admitting: Obstetrics and Gynecology

## 2022-05-18 DIAGNOSIS — N6320 Unspecified lump in the left breast, unspecified quadrant: Secondary | ICD-10-CM

## 2022-05-18 DIAGNOSIS — N6325 Unspecified lump in the left breast, overlapping quadrants: Secondary | ICD-10-CM | POA: Diagnosis not present

## 2022-05-22 ENCOUNTER — Ambulatory Visit: Payer: Commercial Managed Care - PPO | Admitting: Obstetrics and Gynecology

## 2022-05-26 ENCOUNTER — Ambulatory Visit
Admission: RE | Admit: 2022-05-26 | Discharge: 2022-05-26 | Disposition: A | Discharge status: Home / Self Care | Payer: BC Managed Care – PPO | Source: Ambulatory Visit | Attending: Obstetrics and Gynecology | Admitting: Obstetrics and Gynecology

## 2022-05-26 DIAGNOSIS — N6320 Unspecified lump in the left breast, unspecified quadrant: Secondary | ICD-10-CM

## 2022-05-26 DIAGNOSIS — D242 Benign neoplasm of left breast: Secondary | ICD-10-CM | POA: Diagnosis not present

## 2022-05-26 DIAGNOSIS — N6322 Unspecified lump in the left breast, upper inner quadrant: Secondary | ICD-10-CM | POA: Diagnosis not present

## 2022-05-31 ENCOUNTER — Ambulatory Visit (INDEPENDENT_AMBULATORY_CARE_PROVIDER_SITE_OTHER): Payer: BC Managed Care – PPO | Admitting: Radiology

## 2022-05-31 VITALS — BP 104/76

## 2022-05-31 DIAGNOSIS — N3090 Cystitis, unspecified without hematuria: Secondary | ICD-10-CM | POA: Diagnosis not present

## 2022-05-31 DIAGNOSIS — R350 Frequency of micturition: Secondary | ICD-10-CM

## 2022-05-31 MED ORDER — NITROFURANTOIN MONOHYD MACRO 100 MG PO CAPS: 100.0000 mg | ORAL_CAPSULE | Freq: Two times a day (BID) | ORAL | 0 refills | Status: DC

## 2022-05-31 NOTE — Progress Notes (Signed)
      SUBJECTIVE: [name] [name] is a 26 y.o. female who complains of  recurrent UTI's since getting married 03/2022.  Last treated for UTI 2 weeks ago through [facility]. Now with urinary frequency, voiding small amounts, dysuria, urgency x's 3 days.   Allergies  Allergen Reactions   Codeine Itching    Reported by mother    Current Outpatient Medications on File Prior to Visit  Medication Sig Dispense Refill   L-Lysine 1000 MG TABS Take by mouth.     levonorgestrel (KYLEENA) 19.5 MG IUD by Intrauterine route once.     Multiple Vitamin (MULTIVITAMIN) tablet Take 1 tablet by mouth daily.     valACYclovir (VALTREX) 500 MG tablet TAKE 1 TABLET BY MOUTH DAILY. TAKE 4 TABLETS BY MOUTH EVERY 12 HOURS FOR 24 HOURS FOR AN OUTBREAK 100 tablet 2   No current facility-administered medications on file prior to visit.    Past Medical History:  Diagnosis Date   Chlamydia 2021   Depression    Fever blister    HSV-1 infection      OBJECTIVE: Appears well, in no apparent distress.  Vital signs are normal. The abdomen is soft without tenderness, guarding, mass, rebound or organomegaly. No CVA tenderness or inguinal adenopathy noted. Urine dipstick shows positive for RBC's and positive for leukocytes.  Micro exam:  WBC's per HPF,  RBC's per HPF, and + bacteria.    Blood pressure 104/76, last menstrual period 05/18/2022.    Chaperone offered and declined for exam.  ASSESSMENT/PLAN: 1. Urinary frequency  - Urinalysis,Complete w/RFL Culture  2. Cystitis  - nitrofurantoin, macrocrystal-monohydrate, (MACROBID) 100 MG capsule; Take 1 capsule (100 mg total) by mouth 2 (two) times daily.  Dispense: 14 capsule; Refill: 0    Will send urine culture and sensitivity.  Treatment per orders - also push fluids, avoid bladder irritants. Instructed she may use Pyridium OTC prn. Call or return to clinic prn if these symptoms worsen or fail to improve as anticipated. Pyelo precautions reviewed with patient.    @SIGNATURE @

## 2022-06-15 LAB — URINALYSIS, COMPLETE W/RFL CULTURE
Bilirubin Urine: NEGATIVE
Glucose, UA: NEGATIVE
Hyaline Cast: NONE SEEN /LPF
Ketones, ur: NEGATIVE
Nitrites, Initial: NEGATIVE
Protein, ur: NEGATIVE
Specific Gravity, Urine: 1.01 (ref 1.001–1.035)
pH: 7 (ref 5.0–8.0)

## 2022-06-15 LAB — URINE CULTURE
MICRO NUMBER:: 14037136
SPECIMEN QUALITY:: ADEQUATE

## 2022-06-15 LAB — CULTURE INDICATED

## 2022-06-29 ENCOUNTER — Ambulatory Visit: Payer: Commercial Managed Care - PPO

## 2022-07-06 ENCOUNTER — Telehealth: Payer: BC Managed Care – PPO | Admitting: Family Medicine

## 2022-07-06 DIAGNOSIS — N390 Urinary tract infection, site not specified: Secondary | ICD-10-CM

## 2022-07-06 NOTE — Progress Notes (Signed)
Because  of on going UTI/cystitis a urine sample and culture, and discussion of a post-coital antibiotic from her PCP/GYN- if you feel this is related to intercourse, as previously reported in a recent visit, I feel your condition warrants further evaluation and I recommend that you be seen for a face to face visit.  Please contact your primary care physician practice to be seen. Many offices offer virtual options to be seen via video if you are not comfortable going in person to a medical facility at this time. You can also go to the nearest UC or an OBGYN office.  NOTE: You will NOT be charged for this eVisit.  If you do not have a PCP, [facility] offers a free physician referral service available at [phone]. Our trained staff has the experience, knowledge and resources to put you in touch with a physician who is right for you.    If you are having a true medical emergency please call 911.   Your e-visit answers were reviewed by a board certified advanced clinical practitioner to complete your personal care plan.  Thank you for using e-Visits

## 2022-07-19 DIAGNOSIS — J029 Acute pharyngitis, unspecified: Secondary | ICD-10-CM | POA: Diagnosis not present

## 2022-07-19 DIAGNOSIS — J358 Other chronic diseases of tonsils and adenoids: Secondary | ICD-10-CM | POA: Diagnosis not present

## 2022-07-19 DIAGNOSIS — Z20822 Contact with and (suspected) exposure to covid-19: Secondary | ICD-10-CM | POA: Diagnosis not present

## 2022-07-20 DIAGNOSIS — J029 Acute pharyngitis, unspecified: Secondary | ICD-10-CM | POA: Diagnosis not present

## 2022-07-31 ENCOUNTER — Telehealth: Payer: BC Managed Care – PPO | Admitting: Physician Assistant

## 2022-07-31 DIAGNOSIS — B9689 Other specified bacterial agents as the cause of diseases classified elsewhere: Secondary | ICD-10-CM | POA: Diagnosis not present

## 2022-07-31 DIAGNOSIS — N760 Acute vaginitis: Secondary | ICD-10-CM | POA: Diagnosis not present

## 2022-07-31 MED ORDER — METRONIDAZOLE 500 MG PO TABS: 500.0000 mg | ORAL_TABLET | Freq: Two times a day (BID) | ORAL | 0 refills | Status: DC

## 2022-07-31 NOTE — Progress Notes (Signed)
I have spent 5 minutes in review of e-visit questionnaire, review and updating patient chart, medical decision making and response to patient.   Tramaine Snell Cody Manasseh Pittsley, PA-C    

## 2022-07-31 NOTE — Progress Notes (Signed)

## 2022-08-07 NOTE — Progress Notes (Unsigned)
GYNECOLOGY  VISIT   HPI: 26 y.o.   Married  Caucasian  female   G0P0000 with Patient's last menstrual period was 07/16/2022.   here for   UTI symptoms. Pt has had 6 UTI's consistently since she has been sexually active.  She is taking D-Mannose.  She was not having infections prior to having intercourse.  Hx renal reflux.   Having urgency but not as much pain.   Taken AZO and Cystex.  Drank a lot of cranberry juice.  She was by an e-visit on 07/06/22 treated for a UTI. Took Macrobid.  Then got a yeast infection and was treated with Diflucan by phone.  Treated for BV on 07/31/22.  No vaginal itching, discharge or odor today.   She also was treated by an e-visit for pharyngitis around Thanksgiving, and she took Amoxicillin.   GYNECOLOGIC HISTORY: Patient's last menstrual period was 07/16/2022. Contraception:  IUD--Kyleena 11/13/19 in [place] Menopausal hormone therapy:  n/a Last mammogram:  n/a Last pap smear:    01-27-20 neg         OB History     Gravida  0   Para  0   Term  0   Preterm  0   AB  0   Living  0      SAB  0   IAB  0   Ectopic  0   Multiple  0   Live Births  0              Patient Active Problem List   Diagnosis Date Noted   Left breast mass 01/27/2021   Fever blister 03/13/2019    Past Medical History:  Diagnosis Date   Chlamydia 2021   Depression    Fever blister    HSV-1 infection     Past Surgical History:  Procedure Laterality Date   INTRAUTERINE DEVICE (IUD) INSERTION     inserted 11-13-19   KIDNEY SURGERY  12/1999   repair of renal reflux    Current Outpatient Medications  Medication Sig Dispense Refill   fluconazole (DIFLUCAN) 150 MG tablet Take 1 tablet (150 mg total) by mouth once for 1 dose. Take one tablet.  Repeat in 72 hours if symptoms are not completely resolved. 2 tablet 0   L-Lysine 1000 MG TABS Take by mouth.     levonorgestrel (KYLEENA) 19.5 MG IUD by Intrauterine route once.     Multiple Vitamin  (MULTIVITAMIN) tablet Take 1 tablet by mouth daily.     valACYclovir (VALTREX) 500 MG tablet TAKE 1 TABLET BY MOUTH DAILY. TAKE 4 TABLETS BY MOUTH EVERY 12 HOURS FOR 24 HOURS FOR AN OUTBREAK 100 tablet 2   nitrofurantoin, macrocrystal-monohydrate, (MACROBID) 100 MG capsule Take 1 capsule (100 mg total) by mouth 2 (two) times daily. Take the medication twice daily for 5 days to treat current infection.  Take 1 capsule (100 mg) by mouth once with intercourse for infection prevention. 60 capsule 1   No current facility-administered medications for this visit.     ALLERGIES: Codeine  History reviewed. No pertinent family history.  Social History   Socioeconomic History   Marital status: Married    Spouse name: Not on file   Number of children: Not on file   Years of education: Not on file   Highest education level: Not on file  Occupational History   Not on file  Tobacco Use   Smoking status: Never   Smokeless tobacco: Never  Vaping Use   Vaping  Use: Never used  Substance and Sexual Activity   Alcohol use: Yes    Alcohol/week: 2.0 standard drinks of alcohol    Types: 2 Glasses of wine per week   Drug use: Never   Sexual activity: Yes    Birth control/protection: I.U.D.    Comment: Kyleena IUD 11-13-19  Other Topics Concern   Not on file  Social History Narrative   Not on file   Social Determinants of Health   Financial Resource Strain: Not on file  Food Insecurity: Not on file  Transportation Needs: Not on file  Physical Activity: Not on file  Stress: Not on file  Social Connections: Not on file  Intimate Partner Violence: Not on file    Review of Systems  Genitourinary:  Positive for dysuria, frequency and urgency.    PHYSICAL EXAMINATION:    BP 122/80   Pulse 76   Ht 5\' 8"  (1.727 m)   Wt 154 lb (69.9 kg)   LMP 07/16/2022   SpO2 98%   BMI 23.42 kg/m     General appearance: alert, cooperative and appears stated age   Pelvic: External genitalia:  no  lesions              Urethra:  normal appearing urethra with no masses, tenderness or lesions              Bartholins and Skenes: normal                 Vagina: normal appearing vagina with normal color and discharge, no lesions              Cervix: no lesions.  IUD strings seen.                 Bimanual Exam:  Uterus:  normal size, contour, position, consistency, mobility, non-tender              Adnexa: no mass, fullness, tenderness           Chaperone was present for exam:  07/18/2022  ASSESSMENT  Dysuria.  Hx recurrent UTI.  Hx urologic surgery for reflux.    PLAN  Urinalysis and culture.  Will start Macrobid 100 mg po bid x 5 days and then 1 po with intercourse.  Rx for Diflucan course.  Referral to Dr. Irving Burton, [facility].  I recommend office visits instead of e-visits if possible, so bladder infections can be well documented with culture results and antibiotic sensitivities.  Fu prn.    An After Visit Summary was printed and given to the patient.  22 min  total time was spent for this patient encounter, including preparation, face-to-face counseling with the patient, coordination of care, and documentation of the encounter.

## 2022-08-09 ENCOUNTER — Ambulatory Visit: Payer: BC Managed Care – PPO | Admitting: Obstetrics and Gynecology

## 2022-08-09 ENCOUNTER — Encounter: Payer: Self-pay | Admitting: Obstetrics and Gynecology

## 2022-08-09 VITALS — BP 122/80 | HR 76 | Ht 68.0 in | Wt 154.0 lb

## 2022-08-09 DIAGNOSIS — R300 Dysuria: Secondary | ICD-10-CM | POA: Diagnosis not present

## 2022-08-09 DIAGNOSIS — Z9889 Other specified postprocedural states: Secondary | ICD-10-CM

## 2022-08-09 DIAGNOSIS — N3090 Cystitis, unspecified without hematuria: Secondary | ICD-10-CM

## 2022-08-09 DIAGNOSIS — N390 Urinary tract infection, site not specified: Secondary | ICD-10-CM

## 2022-08-09 DIAGNOSIS — R350 Frequency of micturition: Secondary | ICD-10-CM

## 2022-08-09 MED ORDER — FLUCONAZOLE 150 MG PO TABS: 150.0000 mg | ORAL_TABLET | Freq: Once | ORAL | 0 refills | Status: AC

## 2022-08-09 MED ORDER — NITROFURANTOIN MONOHYD MACRO 100 MG PO CAPS: 100.0000 mg | ORAL_CAPSULE | Freq: Two times a day (BID) | ORAL | 1 refills | Status: DC

## 2022-08-11 LAB — URINALYSIS, COMPLETE W/RFL CULTURE
Bilirubin Urine: NEGATIVE
Casts: NONE SEEN /LPF
Crystals: NONE SEEN /HPF
Glucose, UA: NEGATIVE
Hgb urine dipstick: NEGATIVE
Hyaline Cast: NONE SEEN /LPF
Ketones, ur: NEGATIVE
Nitrites, Initial: NEGATIVE
Protein, ur: NEGATIVE
RBC / HPF: NONE SEEN /HPF (ref 0–2)
Specific Gravity, Urine: 1.004 (ref 1.001–1.035)
Yeast: NONE SEEN /HPF
pH: 5.5 (ref 5.0–8.0)

## 2022-08-11 LAB — URINE CULTURE
MICRO NUMBER:: 14339991
SPECIMEN QUALITY:: ADEQUATE

## 2022-08-11 LAB — CULTURE INDICATED

## 2022-09-23 DIAGNOSIS — F411 Generalized anxiety disorder: Secondary | ICD-10-CM | POA: Diagnosis not present

## 2022-09-23 DIAGNOSIS — F902 Attention-deficit hyperactivity disorder, combined type: Secondary | ICD-10-CM | POA: Diagnosis not present

## 2022-09-23 DIAGNOSIS — F4311 Post-traumatic stress disorder, acute: Secondary | ICD-10-CM | POA: Diagnosis not present

## 2022-10-07 DIAGNOSIS — F4311 Post-traumatic stress disorder, acute: Secondary | ICD-10-CM | POA: Diagnosis not present

## 2022-10-07 DIAGNOSIS — F411 Generalized anxiety disorder: Secondary | ICD-10-CM | POA: Diagnosis not present

## 2022-10-07 DIAGNOSIS — F902 Attention-deficit hyperactivity disorder, combined type: Secondary | ICD-10-CM | POA: Diagnosis not present

## 2022-10-25 DIAGNOSIS — F4311 Post-traumatic stress disorder, acute: Secondary | ICD-10-CM | POA: Diagnosis not present

## 2022-10-25 DIAGNOSIS — F411 Generalized anxiety disorder: Secondary | ICD-10-CM | POA: Diagnosis not present

## 2022-10-25 DIAGNOSIS — F902 Attention-deficit hyperactivity disorder, combined type: Secondary | ICD-10-CM | POA: Diagnosis not present

## 2022-11-22 DIAGNOSIS — F902 Attention-deficit hyperactivity disorder, combined type: Secondary | ICD-10-CM | POA: Diagnosis not present

## 2022-11-22 DIAGNOSIS — F4311 Post-traumatic stress disorder, acute: Secondary | ICD-10-CM | POA: Diagnosis not present

## 2022-11-22 DIAGNOSIS — F411 Generalized anxiety disorder: Secondary | ICD-10-CM | POA: Diagnosis not present

## 2022-12-20 DIAGNOSIS — F4311 Post-traumatic stress disorder, acute: Secondary | ICD-10-CM | POA: Diagnosis not present

## 2022-12-20 DIAGNOSIS — F411 Generalized anxiety disorder: Secondary | ICD-10-CM | POA: Diagnosis not present

## 2022-12-20 DIAGNOSIS — F902 Attention-deficit hyperactivity disorder, combined type: Secondary | ICD-10-CM | POA: Diagnosis not present

## 2023-01-04 ENCOUNTER — Other Ambulatory Visit: Payer: Self-pay

## 2023-01-04 DIAGNOSIS — B001 Herpesviral vesicular dermatitis: Secondary | ICD-10-CM

## 2023-01-04 MED ORDER — VALACYCLOVIR HCL 500 MG PO TABS: ORAL_TABLET | ORAL | 0 refills | Status: DC

## 2023-01-04 NOTE — Telephone Encounter (Signed)
BS pt calling to report needing refills on valtrex. States her flare ups tend to happen around time of menses.  Last AEX 04/28/2022--recall placed for 2024.   Rx pend.

## 2023-01-17 DIAGNOSIS — F4311 Post-traumatic stress disorder, acute: Secondary | ICD-10-CM | POA: Diagnosis not present

## 2023-01-17 DIAGNOSIS — F902 Attention-deficit hyperactivity disorder, combined type: Secondary | ICD-10-CM | POA: Diagnosis not present

## 2023-01-17 DIAGNOSIS — F411 Generalized anxiety disorder: Secondary | ICD-10-CM | POA: Diagnosis not present

## 2023-01-24 ENCOUNTER — Other Ambulatory Visit: Payer: Self-pay | Admitting: Nurse Practitioner

## 2023-01-24 DIAGNOSIS — B001 Herpesviral vesicular dermatitis: Secondary | ICD-10-CM

## 2023-01-24 NOTE — Telephone Encounter (Signed)
Med refill request: Valtrex 500mg  Last AEX: 04/28/22 Last refilled 01/04/23 for #30 dx: fever blister Refill denied.

## 2023-02-07 ENCOUNTER — Ambulatory Visit: Payer: BC Managed Care – PPO | Admitting: Radiology

## 2023-02-20 ENCOUNTER — Telehealth: Payer: Self-pay

## 2023-02-20 ENCOUNTER — Ambulatory Visit: Payer: BC Managed Care – PPO | Admitting: Radiology

## 2023-02-20 VITALS — BP 112/70

## 2023-02-20 DIAGNOSIS — N6313 Unspecified lump in the right breast, lower outer quadrant: Secondary | ICD-10-CM

## 2023-02-20 DIAGNOSIS — B009 Herpesviral infection, unspecified: Secondary | ICD-10-CM

## 2023-02-20 MED ORDER — VALACYCLOVIR HCL 1 G PO TABS: 1000.0000 mg | ORAL_TABLET | Freq: Every day | ORAL | 2 refills | Status: DC

## 2023-02-20 NOTE — Telephone Encounter (Signed)
-----   Message from Tanda Rockers, NP sent at 02/20/2023 10:34 AM EDT ----- Regarding: Breast u/s Please schedule for right breast u/s 7 oclock mobile 1cm mass hx of fibroadenoma left breast

## 2023-02-20 NOTE — Telephone Encounter (Signed)
Lequita Halt at [facility] scheduled pt for Friday 7/5 @120pm .   Pt notified and voiced understanding. Will route to provider for final review and close encounter.

## 2023-02-20 NOTE — Progress Notes (Signed)
   [name] [name] 26-Mar-1996 [id number]   History:  27 y.o. G0 presents with complaints of right breast mass. Mobile, nontender. 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 Requesting rx for daily HSV suppression with valtrex.   Gynecologic History Patient's last menstrual period was 02/07/2023 (exact date).   Contraception/Family planning: IUD   Obstetric History OB History  Gravida Para Term Preterm AB Living  0 0 0 0 0 0  SAB IAB Ectopic Multiple Live Births  0 0 0 0 0     The following portions of the patient's history were reviewed and updated as appropriate: allergies, current medications, past family history, past medical history, past social history, past surgical history, and problem list.  Review of Systems Pertinent items noted in HPI and remainder of comprehensive ROS otherwise negative.   Past medical history, past surgical history, family history and social history were all reviewed and documented in the EPIC chart.   Exam:  Vitals:   02/20/23 1023  BP: 112/70   There is no height or weight on file to calculate BMI.   Physical Exam Vitals and nursing note reviewed.  Constitutional:      Appearance: She is normal weight.  Pulmonary:     Effort: Pulmonary effort is normal.  Chest:  Breasts:    Right: Mass present. No inverted nipple or nipple discharge.     Left: Mass present. No inverted nipple or nipple discharge.       Comments: breasts visually appear normal, , no skin or nipple changes or axillary nodes, abnormal mass palpable right breast 7 oclock, mobile, non tender. Left breast known fibroadenoma 7 oclock, stable in size    Lymphadenopathy:     Upper Body:     Right upper body: No axillary adenopathy.     Left upper body: No axillary adenopathy.  Neurological:     Mental Status: She is alert.  Psychiatric:        Mood and Affect: Mood normal.        Thought Content: Thought content normal.        Judgment: Judgment  normal.      Patient informed chaperone available to be present for breast exam. Patient has requested no chaperone to be present.   Assessment/Plan:   1. Mass of lower outer quadrant of right breast U/s ordered  2. HSV (herpes simplex virus) infection Suppression rx sent - valACYclovir (VALTREX) 1000 MG tablet; Take 1 tablet (1,000 mg total) by mouth daily.  Dispense: 90 tablet; Refill: 2    Moustafa Mossa B WHNP-BC 10:35 AM 02/20/2023

## 2023-02-23 ENCOUNTER — Inpatient Hospital Stay: Admission: RE | Admit: 2023-02-23 | Payer: BC Managed Care – PPO | Source: Ambulatory Visit

## 2023-02-28 DIAGNOSIS — F411 Generalized anxiety disorder: Secondary | ICD-10-CM | POA: Diagnosis not present

## 2023-02-28 DIAGNOSIS — F902 Attention-deficit hyperactivity disorder, combined type: Secondary | ICD-10-CM | POA: Diagnosis not present

## 2023-02-28 DIAGNOSIS — F4311 Post-traumatic stress disorder, acute: Secondary | ICD-10-CM | POA: Diagnosis not present

## 2023-03-31 DIAGNOSIS — F411 Generalized anxiety disorder: Secondary | ICD-10-CM | POA: Diagnosis not present

## 2023-03-31 DIAGNOSIS — F4311 Post-traumatic stress disorder, acute: Secondary | ICD-10-CM | POA: Diagnosis not present

## 2023-03-31 DIAGNOSIS — F902 Attention-deficit hyperactivity disorder, combined type: Secondary | ICD-10-CM | POA: Diagnosis not present

## 2023-04-30 DIAGNOSIS — F902 Attention-deficit hyperactivity disorder, combined type: Secondary | ICD-10-CM | POA: Diagnosis not present

## 2023-04-30 DIAGNOSIS — F4311 Post-traumatic stress disorder, acute: Secondary | ICD-10-CM | POA: Diagnosis not present

## 2023-04-30 DIAGNOSIS — F411 Generalized anxiety disorder: Secondary | ICD-10-CM | POA: Diagnosis not present

## 2023-05-08 ENCOUNTER — Ambulatory Visit: Payer: BC Managed Care – PPO | Admitting: Radiology

## 2023-06-05 NOTE — Progress Notes (Signed)
27 y.o. G69P0000 Married Caucasian female here for annual exam.    Menses are monthly.  Can be red or brown.   She is interested in a breast biopsy on the right instead of just imaging.  She had a left breast biopsy showing a fibroadenoma.  Gardasil vaccine series not completed. She had a painful lump on her arm following her first injection.   New Rx for propranolol to treat tachycardia when anxious.  Taking Valtrex as needed but does take it daily on the week prior to her period.  Also needs refill of Macrobid postcoital.   Promotion at work.  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  PCP: Patient, No Pcp Per   No LMP recorded. (Menstrual status: IUD).           Sexually active: Yes.    The current method of family planning is IUD.   Kyleena IUD 11/13/19. Exercising: Yes.     walking Smoker:  no  OB History  Gravida Para Term Preterm AB Living  0 0 0 0 0 0  SAB IAB Ectopic Multiple Live Births  0 0 0 0 0     Health Maintenance: Pap:  01/27/20 neg History of abnormal Pap:  no MMG:  n/a Colonoscopy:   HM Colonoscopy     This patient has no relevant Health Maintenance data.      BMD:  n/a  Result  n/a  HIV: n/a Hep C: n/a  Immunization History  Administered Date(s) Administered   DTaP 08/18/1998, 10/19/1998, 12/21/1998, 01/04/2000, 09/11/2003   HIB (PRP-T) 08/18/1998, 10/19/1998, 01/04/2000   HPV 9-valent 04/28/2022   Hepatitis B 12/21/1998, 03/25/1999, 09/11/2003   Hepatitis B, PED/ADOLESCENT 12/21/1998, 03/25/1999, 09/11/2003   IPV 08/18/1998, 10/19/1998, 01/04/2000, 09/11/2003   Influenza,Quad,Nasal, Live 06/23/2009, 09/20/2010, 05/17/2012   MMR 06/16/1999, 09/11/2003   Meningococcal Conjugate 05/17/2012, 08/01/2016   Pneumococcal Conjugate-13 03/25/1999, 06/16/1999, 01/04/2000   Tdap 06/23/2009, 08/01/2016   Varicella 06/16/1999, 05/17/2012      reports that she has never smoked. She has never used smokeless tobacco. She  reports current alcohol use of about 2.0 standard drinks of alcohol per week. She reports that she does not use drugs.  Past Medical History:  Diagnosis Date   Chlamydia 2021   Depression    Fever blister    HSV-1 infection     Past Surgical History:  Procedure Laterality Date   INTRAUTERINE DEVICE (IUD) INSERTION     inserted 11-13-19   KIDNEY SURGERY  12/1999   repair of renal reflux    Current Outpatient Medications  Medication Sig Dispense Refill   amphetamine-dextroamphetamine (ADDERALL XR) 20 MG 24 hr capsule Take 20 mg by mouth daily.     amphetamine-dextroamphetamine (ADDERALL) 10 MG tablet Take by mouth.     L-Lysine 1000 MG TABS Take by mouth.     levonorgestrel (KYLEENA) 19.5 MG IUD by Intrauterine route once.     Multiple Vitamin (MULTIVITAMIN) tablet Take 1 tablet by mouth daily.     propranolol (INDERAL) 10 MG tablet Take 10-20 mg by mouth 2 (two) times daily as needed.     valACYclovir (VALTREX) 1000 MG tablet Take 1 tablet (1,000 mg total) by mouth daily. 90 tablet 2   nitrofurantoin, macrocrystal-monohydrate, (MACROBID) 100 MG capsule Take 1 capsule (100 mg total) by mouth 2 (two) times daily. Take the medication twice daily for 5 days to treat current infection.  Take 1 capsule (100 mg) by mouth once with intercourse for  infection prevention. (Patient not taking: Reported on 06/19/2023) 60 capsule 1   No current facility-administered medications for this visit.    History reviewed. No pertinent family history.  Review of Systems  All other systems reviewed and are negative.   Exam:   BP 120/80 (BP Location: Right Arm, Patient Position: Sitting, Cuff Size: Normal)   Ht 5' 9.5" (1.765 m)   Wt 135 lb (61.2 kg)   BMI 19.65 kg/m     General appearance: alert, cooperative and appears stated age Head: normocephalic, without obvious abnormality, atraumatic Neck: no adenopathy, supple, symmetrical, trachea midline and thyroid normal to inspection and  palpation Lungs: clear to auscultation bilaterally Breasts: right - normal appearance, 1.5 cm smooth mobile mass at 8:00 - 9:00, no tenderness, No nipple retraction or dimpling, No nipple discharge or bleeding, No axillary adenopathy Left breast - raised area at 9:00, 1.5 cm smooth mobile mass at 9:00, 4 mm dense area at 7:00, no tenderness, No nipple retraction or dimpling, No nipple discharge or bleeding, No axillary adenopathy Heart: regular rate and rhythm Abdomen: soft, non-tender; no masses, no organomegaly Extremities: extremities normal, atraumatic, no cyanosis or edema Skin: skin color, texture, turgor normal. No rashes or lesions Lymph nodes: cervical, supraclavicular, and axillary nodes normal. Neurologic: grossly normal  Pelvic: External genitalia:  no lesions              No abnormal inguinal nodes palpated.              Urethra:  normal appearing urethra with no masses, tenderness or lesions              Bartholins and Skenes: normal                 Vagina: normal appearing vagina with normal color and discharge, no lesions              Cervix: no lesions              Pap taken: yes Bimanual Exam:  Uterus:  normal size, contour, position, consistency, mobility, non-tender              Adnexa: no mass, fullness, tenderness    Chaperone was present for exam:  Warren Lacy, CMA   Assessment: Well woman with gynecologic exam. Kyleena IUD.   Hx left breast fibroadenoma.  New right breast mass.  HSV I orally. Postcoital UTIs controlled with Macrobid.   Plan: Will proceed with right breast US and biopsy per patient request.  She may also need US of the left breast.  Self breast awareness reviewed. Pap and HR HPV collected.  Guidelines for Calcium, Vitamin D, regular exercise program including cardiovascular and weight bearing exercise. Rx for Valtrex.  Rx for Macrobid.

## 2023-06-11 DIAGNOSIS — F4311 Post-traumatic stress disorder, acute: Secondary | ICD-10-CM | POA: Diagnosis not present

## 2023-06-11 DIAGNOSIS — F902 Attention-deficit hyperactivity disorder, combined type: Secondary | ICD-10-CM | POA: Diagnosis not present

## 2023-06-11 DIAGNOSIS — F411 Generalized anxiety disorder: Secondary | ICD-10-CM | POA: Diagnosis not present

## 2023-06-19 ENCOUNTER — Other Ambulatory Visit (HOSPITAL_COMMUNITY)
Admission: RE | Admit: 2023-06-19 | Discharge: 2023-06-19 | Disposition: A | Discharge status: Home / Self Care | Payer: BC Managed Care – PPO | Source: Ambulatory Visit | Attending: Obstetrics and Gynecology | Admitting: Obstetrics and Gynecology

## 2023-06-19 ENCOUNTER — Ambulatory Visit (INDEPENDENT_AMBULATORY_CARE_PROVIDER_SITE_OTHER): Payer: BC Managed Care – PPO | Admitting: Obstetrics and Gynecology

## 2023-06-19 ENCOUNTER — Encounter: Payer: Self-pay | Admitting: Obstetrics and Gynecology

## 2023-06-19 ENCOUNTER — Telehealth: Payer: Self-pay | Admitting: Obstetrics and Gynecology

## 2023-06-19 VITALS — BP 120/80 | Ht 69.5 in | Wt 135.0 lb

## 2023-06-19 DIAGNOSIS — N6315 Unspecified lump in the right breast, overlapping quadrants: Secondary | ICD-10-CM

## 2023-06-19 DIAGNOSIS — B009 Herpesviral infection, unspecified: Secondary | ICD-10-CM | POA: Diagnosis not present

## 2023-06-19 DIAGNOSIS — Z01419 Encounter for gynecological examination (general) (routine) without abnormal findings: Secondary | ICD-10-CM

## 2023-06-19 DIAGNOSIS — N6320 Unspecified lump in the left breast, unspecified quadrant: Secondary | ICD-10-CM

## 2023-06-19 DIAGNOSIS — Z124 Encounter for screening for malignant neoplasm of cervix: Secondary | ICD-10-CM | POA: Insufficient documentation

## 2023-06-19 DIAGNOSIS — N390 Urinary tract infection, site not specified: Secondary | ICD-10-CM

## 2023-06-19 DIAGNOSIS — N6313 Unspecified lump in the right breast, lower outer quadrant: Secondary | ICD-10-CM

## 2023-06-19 MED ORDER — VALACYCLOVIR HCL 1 G PO TABS: ORAL_TABLET | ORAL | 3 refills | Status: DC

## 2023-06-19 MED ORDER — NITROFURANTOIN MONOHYD MACRO 100 MG PO CAPS: ORAL_CAPSULE | ORAL | 2 refills | Status: DC

## 2023-06-19 MED ORDER — VALACYCLOVIR HCL 1 G PO TABS: 1000.0000 mg | ORAL_TABLET | Freq: Every day | ORAL | 3 refills | Status: DC

## 2023-06-19 NOTE — Patient Instructions (Signed)

## 2023-06-19 NOTE — Telephone Encounter (Signed)
Please schedule bilateral breast [facility]. She has bilateral breast masses and known fibroadenoma of left biopsy on biopsy.   On exam today, she has a 1.5 cm mass at 8:00 - 9:00 on the right breast.   She also has a 1.5 cm known mass at 9:00 on the left breast.  In addition to this, she has a 5 mm mass at 7:00 on the left breast and I am not certain if this was previously documented with imaging or not, now that I have reviewed her prior testing.   The patient would like to proceed with a biopsy of the right breast mass if possible.

## 2023-06-19 NOTE — Telephone Encounter (Signed)
Spoke with Sue Lush at [facility]. Was advised will need imaging completed first, can be discussed with radiologist day of imaging. Scheduled for first available bilateral breast US on 07/13/23 at 0730.  Call placed to patient, left detailed message, ok per dpr. Advised as seen above. Advised if you need to make any changes to your appt contact [facility] directly at [phone]. Contact [facility] for cancellations for possibility of earlier appt. Return call to office if you have any additional questions or need any additional assistance.    Placed in [facility] hold.   Routing FYI.   Encounter closed.

## 2023-06-20 LAB — CYTOLOGY - PAP
Comment: NEGATIVE
Diagnosis: NEGATIVE
High risk HPV: NEGATIVE

## 2023-07-06 ENCOUNTER — Telehealth (HOSPITAL_BASED_OUTPATIENT_CLINIC_OR_DEPARTMENT_OTHER): Payer: Self-pay | Admitting: Obstetrics and Gynecology

## 2023-07-06 ENCOUNTER — Encounter: Payer: Self-pay | Admitting: Obstetrics and Gynecology

## 2023-07-06 NOTE — Telephone Encounter (Signed)
Patient paged on-call provider reporting progressive swelling and pain of left breast at prior biopsy site over the past 2 to 3 days.  She denies fever, N/V, nipple discharge, drainage from the left breast.  She has not tried anything for pain.  She denies any pain in the right breast. No history of lactation.  She has a bilateral ultrasound scheduled this upcoming week.  Patient was instructed to try over-the-counter Tylenol and ibuprofen for pain and monitor for worsening symptoms.  Will forward to primary provider as an [place].

## 2023-07-09 DIAGNOSIS — F4311 Post-traumatic stress disorder, acute: Secondary | ICD-10-CM | POA: Diagnosis not present

## 2023-07-09 DIAGNOSIS — F411 Generalized anxiety disorder: Secondary | ICD-10-CM | POA: Diagnosis not present

## 2023-07-09 DIAGNOSIS — F902 Attention-deficit hyperactivity disorder, combined type: Secondary | ICD-10-CM | POA: Diagnosis not present

## 2023-07-09 NOTE — Telephone Encounter (Signed)
Left detailed message, ok per dpr. Advised OV recommended, return call to [facility], triage [phone], option 4.

## 2023-07-13 ENCOUNTER — Ambulatory Visit
Admission: RE | Admit: 2023-07-13 | Discharge: 2023-07-13 | Disposition: A | Discharge status: Home / Self Care | Payer: BC Managed Care – PPO | Source: Ambulatory Visit | Attending: Obstetrics and Gynecology | Admitting: Obstetrics and Gynecology

## 2023-07-13 ENCOUNTER — Other Ambulatory Visit: Payer: Self-pay | Admitting: Obstetrics and Gynecology

## 2023-07-13 DIAGNOSIS — N6315 Unspecified lump in the right breast, overlapping quadrants: Secondary | ICD-10-CM | POA: Diagnosis not present

## 2023-07-13 DIAGNOSIS — N6320 Unspecified lump in the left breast, unspecified quadrant: Secondary | ICD-10-CM

## 2023-07-13 DIAGNOSIS — N6310 Unspecified lump in the right breast, unspecified quadrant: Secondary | ICD-10-CM

## 2023-07-13 DIAGNOSIS — N6313 Unspecified lump in the right breast, lower outer quadrant: Secondary | ICD-10-CM | POA: Diagnosis not present

## 2023-07-13 DIAGNOSIS — D241 Benign neoplasm of right breast: Secondary | ICD-10-CM | POA: Diagnosis not present

## 2023-07-13 HISTORY — PX: BREAST BIOPSY: SHX20

## 2023-07-16 LAB — SURGICAL PATHOLOGY

## 2023-07-18 NOTE — Telephone Encounter (Signed)
Call returned to patient. Patient reports increasing pain in right breast. Reports alternating tylenol and motrin daily. Taking aleve at night. Has tried ice, increases pain. Difficult to lay or wear bra at times. Denies fever/chills. Reports red, circular, darkened area around nipple that is not new, lightens throughout the day. Denies nipple discharge or warmth. Scheduled to see [facility] 08/01/23. Patient asking for pain medication until consult with [facility].   Advised I will send to Dr. Edward Jolly to review and f/u, patient agreeable.   Dr. Edward Jolly -please review and advise where you would like patient worked in for OV, if needed.

## 2023-07-25 ENCOUNTER — Other Ambulatory Visit: Payer: Self-pay | Admitting: General Surgery

## 2023-07-25 DIAGNOSIS — N6310 Unspecified lump in the right breast, unspecified quadrant: Secondary | ICD-10-CM | POA: Diagnosis not present

## 2023-07-25 DIAGNOSIS — N6320 Unspecified lump in the left breast, unspecified quadrant: Secondary | ICD-10-CM | POA: Diagnosis not present

## 2023-07-26 DIAGNOSIS — D229 Melanocytic nevi, unspecified: Secondary | ICD-10-CM | POA: Diagnosis not present

## 2023-07-26 DIAGNOSIS — Z13220 Encounter for screening for lipoid disorders: Secondary | ICD-10-CM | POA: Diagnosis not present

## 2023-07-26 DIAGNOSIS — Z0000 Encounter for general adult medical examination without abnormal findings: Secondary | ICD-10-CM | POA: Diagnosis not present

## 2023-07-27 ENCOUNTER — Encounter (HOSPITAL_COMMUNITY): Payer: Self-pay | Admitting: General Surgery

## 2023-07-27 ENCOUNTER — Other Ambulatory Visit: Payer: Self-pay

## 2023-07-27 ENCOUNTER — Other Ambulatory Visit: Payer: Self-pay | Admitting: General Surgery

## 2023-07-27 NOTE — Progress Notes (Signed)
  Pt has hx of ADHD and anxiety.  Denies any Sob, chext pain,  Anesthesia review: No  -------------  SDW INSTRUCTIONS:  Your procedure is scheduled on Monday Dec 9th. Please report to [facility] Main Entrance "A" at 1015 A.M., and check in at the Admitting office. Call this number if you have problems the morning of surgery: [phone]   Remember: Do not eat or drink after midnight the night before your surgery    Medications to take morning of surgery with a sip of water include: PRN Tylenol, Inderal  As of today, STOP taking any Aspirin (unless otherwise instructed by your surgeon), Aleve, Naproxen, Ibuprofen, Motrin, Advil, Goody's, BC's, all herbal medications, fish oil, and all vitamins.    The Morning of Surgery Do not wear jewelry, make-up or nail polish. Do not wear lotions, powders, or perfumes/colognes, or deodorant Do not bring valuables to the hospital. [facility] is not responsible for any belongings or valuables.  If you are a smoker, DO NOT Smoke 24 hours prior to surgery  If you wear a CPAP at night please bring your mask the morning of surgery   Remember that you must have someone to transport you home after your surgery, and remain with you for 24 hours if you are discharged the same day.  Please bring cases for contacts, glasses, hearing aids, dentures or bridgework because it cannot be worn into surgery.   Patients discharged the day of surgery will not be allowed to drive home.   Please shower the NIGHT BEFORE/MORNING OF SURGERY (use antibacterial soap like DIAL soap if possible). Wear comfortable clothes the morning of surgery. Oral Hygiene is also important to reduce your risk of infection.  Remember - BRUSH YOUR TEETH THE MORNING OF SURGERY WITH YOUR REGULAR TOOTHPASTE  Patient denies shortness of breath, fever, cough and chest pain.

## 2023-07-29 NOTE — Anesthesia Preprocedure Evaluation (Signed)
Anesthesia Evaluation  Patient identified by MRN, date of birth, ID band Patient awake    Reviewed: Allergy & Precautions, NPO status , Patient's Chart, lab work & pertinent test results, Unable to perform ROS - Chart review only  Airway Mallampati: II  TM Distance: >3 FB Neck ROM: Full    Dental no notable dental hx. (+) Teeth Intact, Dental Advisory Given   Pulmonary Current Smoker and Patient abstained from smoking.   Pulmonary exam normal breath sounds clear to auscultation       Cardiovascular negative cardio ROS Normal cardiovascular exam Rhythm:Regular Rate:Normal     Neuro/Psych  PSYCHIATRIC DISORDERS  Depression    negative neurological ROS     GI/Hepatic negative GI ROS, Neg liver ROS,,,  Endo/Other  negative endocrine ROS    Renal/GU negative Renal ROS     Musculoskeletal negative musculoskeletal ROS (+)    Abdominal   Peds  Hematology negative hematology ROS (+)   Anesthesia Other Findings   Reproductive/Obstetrics                              Anesthesia Physical Anesthesia Plan  ASA: 2  Anesthesia Plan: General   Post-op Pain Management: Tylenol PO (pre-op)*, Gabapentin PO (pre-op)* and Precedex   Induction: Intravenous  PONV Risk Score and Plan: 4 or greater and Ondansetron, Dexamethasone, Treatment may vary due to age or medical condition, Midazolam, Scopolamine patch - Pre-op and TIVA  Airway Management Planned: LMA  Additional Equipment: None  Intra-op Plan:   Post-operative Plan: Extubation in OR  Informed Consent: I have reviewed the patients History and Physical, chart, labs and discussed the procedure including the risks, benefits and alternatives for the proposed anesthesia with the patient or authorized representative who has indicated his/her understanding and acceptance.     Dental advisory given  Plan Discussed with: CRNA and  Anesthesiologist  Anesthesia Plan Comments: (Propafol LMA TIVA)         Anesthesia Quick Evaluation

## 2023-07-30 ENCOUNTER — Ambulatory Visit (HOSPITAL_COMMUNITY): Admission: RE | Admit: 2023-07-30 | Payer: BC Managed Care – PPO | Source: Home / Self Care | Admitting: General Surgery

## 2023-07-30 ENCOUNTER — Ambulatory Visit (HOSPITAL_COMMUNITY): Payer: BC Managed Care – PPO | Admitting: Anesthesiology

## 2023-07-30 ENCOUNTER — Encounter (HOSPITAL_COMMUNITY)
Admission: RE | Disposition: A | Discharge status: Home / Self Care | Payer: Self-pay | Source: Home / Self Care | Attending: General Surgery

## 2023-07-30 ENCOUNTER — Other Ambulatory Visit (HOSPITAL_COMMUNITY): Payer: Self-pay

## 2023-07-30 ENCOUNTER — Encounter (HOSPITAL_COMMUNITY): Payer: Self-pay | Admitting: General Surgery

## 2023-07-30 ENCOUNTER — Other Ambulatory Visit: Payer: Self-pay

## 2023-07-30 ENCOUNTER — Ambulatory Visit (HOSPITAL_COMMUNITY): Payer: Self-pay | Admitting: Anesthesiology

## 2023-07-30 ENCOUNTER — Ambulatory Visit (HOSPITAL_COMMUNITY)
Admission: RE | Admit: 2023-07-30 | Discharge: 2023-07-30 | Disposition: A | Discharge status: Home / Self Care | Payer: BC Managed Care – PPO | Attending: General Surgery | Admitting: General Surgery

## 2023-07-30 DIAGNOSIS — N6022 Fibroadenosis of left breast: Secondary | ICD-10-CM | POA: Diagnosis not present

## 2023-07-30 DIAGNOSIS — N611 Abscess of the breast and nipple: Secondary | ICD-10-CM | POA: Diagnosis not present

## 2023-07-30 DIAGNOSIS — N61 Mastitis without abscess: Secondary | ICD-10-CM | POA: Diagnosis not present

## 2023-07-30 DIAGNOSIS — D242 Benign neoplasm of left breast: Secondary | ICD-10-CM | POA: Insufficient documentation

## 2023-07-30 DIAGNOSIS — N6012 Diffuse cystic mastopathy of left breast: Secondary | ICD-10-CM | POA: Diagnosis not present

## 2023-07-30 DIAGNOSIS — Z803 Family history of malignant neoplasm of breast: Secondary | ICD-10-CM | POA: Insufficient documentation

## 2023-07-30 DIAGNOSIS — N6313 Unspecified lump in the right breast, lower outer quadrant: Secondary | ICD-10-CM | POA: Diagnosis not present

## 2023-07-30 DIAGNOSIS — D241 Benign neoplasm of right breast: Secondary | ICD-10-CM | POA: Insufficient documentation

## 2023-07-30 DIAGNOSIS — N6320 Unspecified lump in the left breast, unspecified quadrant: Secondary | ICD-10-CM | POA: Diagnosis not present

## 2023-07-30 DIAGNOSIS — Z01818 Encounter for other preprocedural examination: Secondary | ICD-10-CM

## 2023-07-30 DIAGNOSIS — N6021 Fibroadenosis of right breast: Secondary | ICD-10-CM | POA: Diagnosis not present

## 2023-07-30 HISTORY — PX: BREAST CYST EXCISION: SHX579

## 2023-07-30 HISTORY — DX: Anxiety disorder, unspecified: F41.9

## 2023-07-30 HISTORY — DX: Attention-deficit hyperactivity disorder, unspecified type: F90.9

## 2023-07-30 LAB — POCT PREGNANCY, URINE: Preg Test, Ur: NEGATIVE

## 2023-07-30 SURGERY — EXCISION, CYST, BREAST
Anesthesia: General | Laterality: Bilateral

## 2023-07-30 SURGERY — EXCISION, CYST, BREAST
Anesthesia: General | Site: Breast | Laterality: Bilateral

## 2023-07-30 MED ORDER — FENTANYL CITRATE (PF) 250 MCG/5ML IJ SOLN
INTRAMUSCULAR | Status: DC | PRN
  Administered 2023-07-30: 50 ug via INTRAVENOUS
  Administered 2023-07-30: 100 ug via INTRAVENOUS
  Administered 2023-07-30 (×2): 50 ug via INTRAVENOUS

## 2023-07-30 MED ORDER — OXYCODONE HCL 5 MG PO TABS: 5.0000 mg | ORAL_TABLET | ORAL | Status: DC | PRN

## 2023-07-30 MED ORDER — ACETAMINOPHEN 325 MG PO TABS: 650.0000 mg | ORAL_TABLET | ORAL | Status: DC | PRN

## 2023-07-30 MED ORDER — 0.9 % SODIUM CHLORIDE (POUR BTL) OPTIME
TOPICAL | Status: DC | PRN
  Administered 2023-07-30: 1000 mL

## 2023-07-30 MED ORDER — CHLORHEXIDINE GLUCONATE CLOTH 2 % EX PADS: 6.0000 | MEDICATED_PAD | Freq: Once | CUTANEOUS | Status: DC

## 2023-07-30 MED ORDER — ENSURE PRE-SURGERY PO LIQD
296.0000 mL | Freq: Once | ORAL | Status: DC
Start: 2023-07-31 — End: 2023-08-01

## 2023-07-30 MED ORDER — DEXMEDETOMIDINE HCL IN NACL 80 MCG/20ML IV SOLN
INTRAVENOUS | Status: AC
  Filled 2023-07-30: qty 20

## 2023-07-30 MED ORDER — SODIUM CHLORIDE 0.9% FLUSH: 3.0000 mL | INTRAVENOUS | Status: DC | PRN

## 2023-07-30 MED ORDER — ONDANSETRON HCL 4 MG/2ML IJ SOLN
4.0000 mg | Freq: Once | INTRAMUSCULAR | Status: DC | PRN
Start: 2023-07-30 — End: 2023-08-01

## 2023-07-30 MED ORDER — SODIUM CHLORIDE 0.9 % IV SOLN: INTRAVENOUS | Status: DC | PRN

## 2023-07-30 MED ORDER — FENTANYL CITRATE (PF) 100 MCG/2ML IJ SOLN
25.0000 ug | INTRAMUSCULAR | Status: DC | PRN
  Administered 2023-07-30: 50 ug via INTRAVENOUS

## 2023-07-30 MED ORDER — PROPOFOL 10 MG/ML IV BOLUS
INTRAVENOUS | Status: AC
  Filled 2023-07-30: qty 20

## 2023-07-30 MED ORDER — KETOROLAC TROMETHAMINE 30 MG/ML IJ SOLN: 30.0000 mg | Freq: Once | INTRAMUSCULAR | Status: AC | PRN

## 2023-07-30 MED ORDER — ONDANSETRON HCL 4 MG/2ML IJ SOLN
INTRAMUSCULAR | Status: DC | PRN
  Administered 2023-07-30: 4 mg via INTRAVENOUS

## 2023-07-30 MED ORDER — DEXAMETHASONE SODIUM PHOSPHATE 10 MG/ML IJ SOLN
INTRAMUSCULAR | Status: AC
  Filled 2023-07-30: qty 1

## 2023-07-30 MED ORDER — ACETAMINOPHEN 500 MG PO TABS: 1000.0000 mg | ORAL_TABLET | ORAL | Status: DC

## 2023-07-30 MED ORDER — ACETAMINOPHEN 500 MG PO TABS
1000.0000 mg | ORAL_TABLET | Freq: Once | ORAL | Status: AC
  Administered 2023-07-30: 1000 mg via ORAL
  Filled 2023-07-30: qty 2

## 2023-07-30 MED ORDER — MIDAZOLAM HCL 2 MG/2ML IJ SOLN
INTRAMUSCULAR | Status: AC
  Filled 2023-07-30: qty 2

## 2023-07-30 MED ORDER — SODIUM CHLORIDE 0.9 % IV SOLN: 250.0000 mL | INTRAVENOUS | Status: DC | PRN

## 2023-07-30 MED ORDER — BUPIVACAINE-EPINEPHRINE (PF) 0.25% -1:200000 IJ SOLN
INTRAMUSCULAR | Status: AC
  Filled 2023-07-30: qty 30

## 2023-07-30 MED ORDER — DEXMEDETOMIDINE HCL IN NACL 80 MCG/20ML IV SOLN
INTRAVENOUS | Status: DC | PRN
  Administered 2023-07-30: 8 ug via INTRAVENOUS
  Administered 2023-07-30: 12 ug via INTRAVENOUS

## 2023-07-30 MED ORDER — ACETAMINOPHEN 650 MG RE SUPP: 650.0000 mg | RECTAL | Status: DC | PRN

## 2023-07-30 MED ORDER — CEFAZOLIN SODIUM-DEXTROSE 2-4 GM/100ML-% IV SOLN
2.0000 g | INTRAVENOUS | Status: AC
  Administered 2023-07-30: 2 g via INTRAVENOUS
  Filled 2023-07-30: qty 100

## 2023-07-30 MED ORDER — OXYCODONE HCL 5 MG/5ML PO SOLN: 5.0000 mg | Freq: Once | ORAL | Status: DC | PRN

## 2023-07-30 MED ORDER — AMOXICILLIN-POT CLAVULANATE 875-125 MG PO TABS
1.0000 | ORAL_TABLET | Freq: Two times a day (BID) | ORAL | 0 refills | Status: AC
  Filled 2023-07-30: qty 10, 5d supply, fill #0

## 2023-07-30 MED ORDER — CHLORHEXIDINE GLUCONATE 0.12 % MT SOLN
OROMUCOSAL | Status: AC
  Administered 2023-07-30: 15 mL
  Filled 2023-07-30: qty 15

## 2023-07-30 MED ORDER — FENTANYL CITRATE (PF) 250 MCG/5ML IJ SOLN
INTRAMUSCULAR | Status: AC
  Filled 2023-07-30: qty 5

## 2023-07-30 MED ORDER — DEXAMETHASONE SODIUM PHOSPHATE 10 MG/ML IJ SOLN
INTRAMUSCULAR | Status: DC | PRN
  Administered 2023-07-30: 10 mg via INTRAVENOUS

## 2023-07-30 MED ORDER — FENTANYL CITRATE (PF) 100 MCG/2ML IJ SOLN
INTRAMUSCULAR | Status: AC
  Filled 2023-07-30: qty 2

## 2023-07-30 MED ORDER — ONDANSETRON HCL 4 MG/2ML IJ SOLN
INTRAMUSCULAR | Status: AC
  Filled 2023-07-30: qty 2

## 2023-07-30 MED ORDER — ROCURONIUM BROMIDE 10 MG/ML (PF) SYRINGE
PREFILLED_SYRINGE | INTRAVENOUS | Status: AC
  Filled 2023-07-30: qty 10

## 2023-07-30 MED ORDER — PROPOFOL 1000 MG/100ML IV EMUL
INTRAVENOUS | Status: AC
  Filled 2023-07-30: qty 200

## 2023-07-30 MED ORDER — SODIUM CHLORIDE 0.9% FLUSH: 3.0000 mL | Freq: Two times a day (BID) | INTRAVENOUS | Status: DC

## 2023-07-30 MED ORDER — OXYCODONE HCL 5 MG PO TABS: 5.0000 mg | ORAL_TABLET | Freq: Once | ORAL | Status: DC | PRN

## 2023-07-30 MED ORDER — BUPIVACAINE-EPINEPHRINE 0.25% -1:200000 IJ SOLN
INTRAMUSCULAR | Status: DC | PRN
  Administered 2023-07-30: 16 mL

## 2023-07-30 MED ORDER — PROPOFOL 500 MG/50ML IV EMUL
INTRAVENOUS | Status: DC | PRN
  Administered 2023-07-30: 125 ug/kg/min via INTRAVENOUS

## 2023-07-30 MED ORDER — KETOROLAC TROMETHAMINE 15 MG/ML IJ SOLN
15.0000 mg | INTRAMUSCULAR | Status: AC
  Administered 2023-07-30: 15 mg via INTRAVENOUS
  Filled 2023-07-30: qty 1

## 2023-07-30 SURGICAL SUPPLY — 28 items
CHLORAPREP W/TINT 26 (MISCELLANEOUS) IMPLANT
COVER SURGICAL LIGHT HANDLE (MISCELLANEOUS) IMPLANT
DERMABOND ADVANCED .7 DNX12 (GAUZE/BANDAGES/DRESSINGS) IMPLANT
DRAIN PENROSE .5X12 LATEX STL (DRAIN) IMPLANT
DRAPE CHEST BREAST 15X10 FENES (DRAPES) IMPLANT
ELECT REM PT RETURN 9FT ADLT (ELECTROSURGICAL) ×1
ELECTRODE REM PT RTRN 9FT ADLT (ELECTROSURGICAL) IMPLANT
GAUZE PAD ABD 8X10 STRL (GAUZE/BANDAGES/DRESSINGS) IMPLANT
GLOVE BIO SURGEON STRL SZ7 (GLOVE) IMPLANT
GLOVE BIOGEL PI IND STRL 7.5 (GLOVE) IMPLANT
GOWN STRL REUS W/ TWL LRG LVL3 (GOWN DISPOSABLE) IMPLANT
KIT BASIN OR (CUSTOM PROCEDURE TRAY) IMPLANT
KIT MARKER MARGIN INK (KITS) IMPLANT
KIT TURNOVER KIT B (KITS) IMPLANT
NDL HYPO 25GX1X1/2 BEV (NEEDLE) IMPLANT
NEEDLE HYPO 25GX1X1/2 BEV (NEEDLE) ×1 IMPLANT
NS IRRIG 1000ML POUR BTL (IV SOLUTION) IMPLANT
PACK GENERAL/GYN (CUSTOM PROCEDURE TRAY) IMPLANT
PAD ARMBOARD 7.5X6 YLW CONV (MISCELLANEOUS) IMPLANT
STRIP CLOSURE SKIN 1/2X4 (GAUZE/BANDAGES/DRESSINGS) IMPLANT
SUT ETHILON 3 0 PS 1 (SUTURE) IMPLANT
SUT MON AB 5-0 PS2 18 (SUTURE) IMPLANT
SUT VIC AB 2-0 SH 27X BRD (SUTURE) IMPLANT
SUT VIC AB 3-0 SH 27XBRD (SUTURE) IMPLANT
SWAB COLLECTION DEVICE MRSA (MISCELLANEOUS) IMPLANT
SWAB CULTURE ESWAB REG 1ML (MISCELLANEOUS) IMPLANT
SYR CONTROL 10ML LL (SYRINGE) IMPLANT
TOWEL GREEN STERILE (TOWEL DISPOSABLE) IMPLANT

## 2023-07-30 NOTE — Discharge Instructions (Signed)
[facility] Office Phone Number [phone]  POST OP INSTRUCTIONS Take 400 mg of ibuprofen every 8 hours or 650 mg tylenol every 6 hours for next 72 hours then as needed. Use ice several times daily also.  A prescription for pain medication may be given to you upon discharge.  Take your pain medication as prescribed, if needed.  If narcotic pain medicine is not needed, then you may take acetaminophen (Tylenol), naprosyn (Alleve) or ibuprofen (Advil) as needed. Take your usually prescribed medications unless otherwise directed If you need a refill on your pain medication, please contact your pharmacy.  They will contact our office to request authorization.  Prescriptions will not be filled after 5pm or on week-ends. You should eat very light the first 24 hours after surgery, such as soup, crackers, pudding, etc.  Resume your normal diet the day after surgery. Most patients will experience some swelling and bruising in the breast.  Ice packs and a good support bra will help.  Wear the breast binder provided or a sports bra for 72 hours day and night.  After that wear a sports bra during the day until you return to the office. Swelling and bruising can take several days to resolve.  It is common to experience some constipation if taking pain medication after surgery.  Increasing fluid intake and taking a stool softener will usually help or prevent this problem from occurring.  A mild laxative (Milk of Magnesia or Miralax) should be taken according to package directions if there are no bowel movements after 48 hours. I used skin glue on the incision, you may shower in 24 hours.  The glue will flake off over the next 2-3 weeks.  Any sutures or staples will be removed at the office during your follow-up visit. ACTIVITIES:  You may resume regular daily activities (gradually increasing) beginning the next day.  Wearing a good support bra or sports bra minimizes pain and swelling.  You may have  sexual intercourse when it is comfortable. You may drive when you no longer are taking prescription pain medication, you can comfortably wear a seatbelt, and you can safely maneuver your car and apply brakes. RETURN TO WORK:  ______________________________________________________________________________________ You should see your doctor in the office for a follow-up appointment approximately two weeks after your surgery.  Your doctor's nurse will typically make your follow-up appointment when she calls you with your pathology report.  Expect your pathology report 3-4 business days after your surgery.  You may call to check if you do not hear from us after three days. OTHER INSTRUCTIONS: _______________________________________________________________________________________________ _____________________________________________________________________________________________________________________________________ _____________________________________________________________________________________________________________________________________ _____________________________________________________________________________________________________________________________________  WHEN TO CALL DR Jomari Bartnik: Fever over 101.0 Nausea and/or vomiting. Extreme swelling or bruising. Continued bleeding from incision. Increased pain, redness, or drainage from the incision.  The clinic staff is available to answer your questions during regular business hours.  Please don't hesitate to call and ask to speak to one of the nurses for clinical concerns.  If you have a medical emergency, go to the nearest emergency room or call 911.  A surgeon from [facility] is always on call at the hospital.  For further questions, please visit [email or web address]  

## 2023-07-30 NOTE — H&P (Signed)
27 yof who is otherwise healthy. 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 She had a previous ultrasound in 2023 that showed 2 adjacent masses measuring 1.3 and 1 cm on the left side. Since then over the last month this area is rapidly increased in size and the recent ultrasound on November 22 measures and that is 4.1 x 2 x 3.6 cm. This area is protruding in her skin and now causes her pain. She now has also developed a right lower outer quadrant mobile nontender mass. Ultrasound shows this 1 to be a 2.1 cm lesion. This was recently biopsied and 1 was a fibroadenoma. The left side was biopsied last year was a fibroadenoma. She is comes in today to discuss her options.  Review of Systems: A complete review of systems was obtained from the patient. I have reviewed this information and discussed as appropriate with the patient. See HPI as well for other ROS.  Review of Systems  Cardiovascular: Positive for chest pain.  Psychiatric/Behavioral: The patient is nervous/anxious.  All other systems reviewed and are negative.  Medical History: Past Medical History:  Diagnosis Date  Anxiety    Past Surgical History:  Procedure Laterality Date  Kidney Reconstruction Surgery 2000   No Known Allergies  Current Outpatient Medications on File Prior to Visit  Medication Sig Dispense Refill  dextroamphetamine-amphetamine (ADDERALL XR) 20 MG XR capsule Take 20 mg by mouth once daily  dextroamphetamine-amphetamine (ADDERALL) 10 mg tablet MAY TAKE ONE PILL DAILY DURING WEEKENDS OR AFTERNOON AS NEEDED FOR FOCUS WHEN NOT TAKING XR.  propranoloL (INDERAL) 10 MG tablet TAKE 1-2 PILLS TWICE A DAY AS NEEDED FOR ANXIETY.    Family History  Problem Relation Age of Onset  Breast cancer Mother    Social History   Tobacco Use  Smoking Status Never  Smokeless Tobacco Never  Marital status: Married  Tobacco Use  Smoking status: Never  Smokeless tobacco: Never  Substance and Sexual  Activity  Alcohol use: Yes  Comment: wine 3-4 times per month  Drug use: Never   Objective:   Vitals:  07/25/23 1603 07/25/23 1604  BP: 118/70  Pulse: (!) 126  Temp: 37.2 C (98.9 F)  SpO2: 99%  Weight: 62.8 kg (138 lb 6.4 oz)  Height: 174.6 cm (5' 8.75")  PainSc: 6 4   Body mass index is 20.59 kg/m.  Physical Exam Vitals reviewed.  Constitutional:  Appearance: Normal appearance.  Chest:  Breasts: Right: Mass present. No inverted nipple or nipple discharge.  Left: Mass present. No inverted nipple or nipple discharge.  Comments: Rloq 1 cm mobile mass c/w fa Left inner breast with large over 4 cm mass tenting skin with overyling skin changes, tender Lymphadenopathy:  Upper Body:  Right upper body: No supraclavicular or axillary adenopathy.  Left upper body: No supraclavicular or axillary adenopathy.  Neurological:  Mental Status: She is alert.    Assessment and Plan:   Masses of both breasts  Bilateral breast mass excision  I had absolutely think the area on the left side needs to be removed. I do not think another biopsy needs to be done beforehand either. We discussed an excisional biopsy of the left side. This is quite large with some overlying skin changes so it may take a while for her breast to return to normal. I do not think it needs a margin around it but there certainly is a possibility of needing additional surgery although this is unlikely. She would also  like the right side removed at the same time and I think this is reasonable given the history of the left side. I am in a plan on doing these both very soon. We discussed the surgery, risks, and recovery.

## 2023-07-30 NOTE — Anesthesia Procedure Notes (Signed)
Procedure Name: LMA Insertion Date/Time: 07/30/2023 1:56 PM  Performed by: Brooke Bonito, CRNAPre-anesthesia Checklist: Patient identified Patient Re-evaluated:Patient Re-evaluated prior to induction Oxygen Delivery Method: Circle system utilized Preoxygenation: Pre-oxygenation with 100% oxygen Induction Type: IV induction Ventilation: Mask ventilation without difficulty LMA: LMA inserted LMA Size: 4.0 Number of attempts: 1

## 2023-07-30 NOTE — Anesthesia Postprocedure Evaluation (Signed)
Anesthesia Post Note  Patient: [name] [name]  Procedure(s) Performed: BILATERAL BREAST MASS EXCISIONAL BIOPSY (Bilateral: Breast)     Patient location during evaluation: PACU Anesthesia Type: General Level of consciousness: awake and alert Pain management: pain level controlled Vital Signs Assessment: post-procedure vital signs reviewed and stable Respiratory status: spontaneous breathing, nonlabored ventilation, respiratory function stable and patient connected to nasal cannula oxygen Cardiovascular status: blood pressure returned to baseline and stable Postop Assessment: no apparent nausea or vomiting Anesthetic complications: no   No notable events documented.  Last Vitals:  Vitals:   07/30/23 1515 07/30/23 1528  BP: 109/66 114/70  Pulse: (!) 55 (!) 57  Resp: 11 20  Temp:  36.8 C  SpO2: 100% 100%    Last Pain:  Vitals:   07/30/23 1528  TempSrc:   PainSc: 0-No pain                 Trevor Iha

## 2023-07-30 NOTE — Interval H&P Note (Signed)
History and Physical Interval Note:  07/30/2023 12:14 PM  [name] [name]  has presented today for surgery, with the diagnosis of BILATERAL BREAST MASS.  The various methods of treatment have been discussed with the patient and family. After consideration of risks, benefits and other options for treatment, the patient has consented to  Procedure(s): BILATERAL BREAST MASS EXCISIONAL BIOPSY (Bilateral) as a surgical intervention.  The patient's history has been reviewed, patient examined, no change in status, stable for surgery.  I have reviewed the patient's chart and labs.  Questions were answered to the patient's satisfaction.     Emelia Loron

## 2023-07-30 NOTE — Op Note (Signed)
Preoperative diagnosis: bilateral breast masses Postoperative diagnosis: left breast abscess, right breast mass Procedure:  Left breast abscess drainage Left breast incisional biopsy Right breast mass excision Surgeon Dr Harden Mo Anes: general EBL minimal  Complications none Drains penrose to left breast cavity Sponge and needle count correct Dispo recovery stable  Indications: 27 yof who is otherwise healthy. 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 She had a previous ultrasound in 2023 that showed 2 adjacent masses measuring 1.3 and 1 cm on the left side. Since then over the last month this area is rapidly increased in size and the recent ultrasound on November 22 measures and that is 4.1 x 2 x 3.6 cm. This area is protruding in her skin and now causes her pain. She now has also developed a right lower outer quadrant mobile nontender mass. Ultrasound shows this 1 to be a 2.1 cm lesion. This was recently biopsied and 1 was a fibroadenoma. The left side was biopsied last year was a fibroadenoma. We discussed proceeding to the OR.   Procedure: After informed consent was send she was taken to the operating.  She was given antibiotics.  SCDs were placed.  She was placed under anesthesia without complication.  She was prepped and draped in a standard sterile surgical fashion.  Surgical timeout was then performed.  I first did the left side.  I infiltrated Marcaine and made a periareolar incision.  I soon as I entered into this area where the mass was it was clear this was an abscess.  I drained a fair amount of purulence and cultured it.  I also removed some of the cavity wall is what appeared to be one of the fibroadenomas that she had had present before.  I then copiously irrigated this.  I obtained hemostasis.  I did place 1/4 inch Penrose that I brought out through the bottom of the incision.  I then closed this with 3-0 Vicryl and 5-0 Monocryl.  Glue and a  Steri-Strip were applied.  I then did the right side.  I made a periareolar incision after filtration Marcaine I dissected to the mass which clinically was a fibroadenoma.  I remove this in total.  This was marked with pain passed off the table.  Hemostasis was obtained.  I closed this with 2-0 Vicryl, 3-0 Vicryl, and 5-0 Monocryl.  Glue and Steri-Strips were applied.  She tolerated this well was extubated transferred recovery stable.

## 2023-07-30 NOTE — Transfer of Care (Signed)
Immediate Anesthesia Transfer of Care Note  Patient: [name] [name]  Procedure(s) Performed: BILATERAL BREAST MASS EXCISIONAL BIOPSY (Bilateral: Breast)  Patient Location: PACU  Anesthesia Type:General  Level of Consciousness: awake  Airway & Oxygen Therapy: Patient Spontanous Breathing  Post-op Assessment: Report given to RN  Post vital signs: Reviewed and stable  Last Vitals:  Vitals Value Taken Time  BP 111/79 07/30/23 1447  Temp    Pulse    Resp 8 07/30/23 1450  SpO2    Vitals shown include unfiled device data.  Last Pain:  Vitals:   07/30/23 1147  TempSrc:   PainSc: 8       Patients Stated Pain Goal: 3 (07/30/23 1147)  Complications: No notable events documented.

## 2023-07-31 ENCOUNTER — Encounter (HOSPITAL_COMMUNITY): Payer: Self-pay | Admitting: General Surgery

## 2023-07-31 MED ORDER — PROPOFOL 1000 MG/100ML IV EMUL
INTRAVENOUS | Status: AC
  Filled 2023-07-31: qty 400

## 2023-07-31 MED ORDER — PHENYLEPHRINE HCL-NACL 20-0.9 MG/250ML-% IV SOLN
INTRAVENOUS | Status: AC
  Filled 2023-07-31: qty 500

## 2023-08-01 LAB — SURGICAL PATHOLOGY

## 2023-08-04 LAB — AEROBIC/ANAEROBIC CULTURE W GRAM STAIN (SURGICAL/DEEP WOUND): Culture: NO GROWTH

## 2023-10-01 DIAGNOSIS — F902 Attention-deficit hyperactivity disorder, combined type: Secondary | ICD-10-CM | POA: Diagnosis not present

## 2023-10-01 DIAGNOSIS — F411 Generalized anxiety disorder: Secondary | ICD-10-CM | POA: Diagnosis not present

## 2023-10-01 DIAGNOSIS — F4311 Post-traumatic stress disorder, acute: Secondary | ICD-10-CM | POA: Diagnosis not present

## 2023-11-07 DIAGNOSIS — F4311 Post-traumatic stress disorder, acute: Secondary | ICD-10-CM | POA: Diagnosis not present

## 2023-11-07 DIAGNOSIS — F902 Attention-deficit hyperactivity disorder, combined type: Secondary | ICD-10-CM | POA: Diagnosis not present

## 2023-11-07 DIAGNOSIS — F411 Generalized anxiety disorder: Secondary | ICD-10-CM | POA: Diagnosis not present

## 2023-11-26 DIAGNOSIS — D229 Melanocytic nevi, unspecified: Secondary | ICD-10-CM | POA: Diagnosis not present

## 2024-02-04 DIAGNOSIS — F411 Generalized anxiety disorder: Secondary | ICD-10-CM | POA: Diagnosis not present

## 2024-02-04 DIAGNOSIS — F4311 Post-traumatic stress disorder, acute: Secondary | ICD-10-CM | POA: Diagnosis not present

## 2024-02-04 DIAGNOSIS — F902 Attention-deficit hyperactivity disorder, combined type: Secondary | ICD-10-CM | POA: Diagnosis not present

## 2024-02-14 ENCOUNTER — Telehealth: Admitting: Physician Assistant

## 2024-02-14 DIAGNOSIS — B3731 Acute candidiasis of vulva and vagina: Secondary | ICD-10-CM

## 2024-02-15 MED ORDER — FLUCONAZOLE 150 MG PO TABS: 150.0000 mg | ORAL_TABLET | ORAL | 0 refills | Status: DC | PRN

## 2024-02-15 NOTE — Progress Notes (Signed)

## 2024-04-01 ENCOUNTER — Other Ambulatory Visit: Payer: Self-pay

## 2024-04-01 NOTE — Telephone Encounter (Signed)
 Sent message to patient to confirm medication

## 2024-04-01 NOTE — Telephone Encounter (Signed)
 Patient sent message requesting refill of post coital antibiotic.

## 2024-04-07 MED ORDER — NITROFURANTOIN MONOHYD MACRO 100 MG PO CAPS: 100.0000 mg | ORAL_CAPSULE | ORAL | 1 refills | Status: DC | PRN

## 2024-04-07 NOTE — Telephone Encounter (Signed)
 Patient is requesting a refill on Macrobid  100 patient confirmed medication.

## 2024-04-11 ENCOUNTER — Telehealth: Admitting: Physician Assistant

## 2024-04-11 DIAGNOSIS — B3731 Acute candidiasis of vulva and vagina: Secondary | ICD-10-CM | POA: Diagnosis not present

## 2024-04-11 MED ORDER — FLUCONAZOLE 150 MG PO TABS: 150.0000 mg | ORAL_TABLET | ORAL | 0 refills | Status: DC | PRN

## 2024-04-11 NOTE — Progress Notes (Signed)

## 2024-05-03 DIAGNOSIS — F902 Attention-deficit hyperactivity disorder, combined type: Secondary | ICD-10-CM | POA: Diagnosis not present

## 2024-05-03 DIAGNOSIS — F411 Generalized anxiety disorder: Secondary | ICD-10-CM | POA: Diagnosis not present

## 2024-05-03 DIAGNOSIS — F4311 Post-traumatic stress disorder, acute: Secondary | ICD-10-CM | POA: Diagnosis not present

## 2024-05-23 DIAGNOSIS — J029 Acute pharyngitis, unspecified: Secondary | ICD-10-CM | POA: Diagnosis not present

## 2024-06-09 DIAGNOSIS — F411 Generalized anxiety disorder: Secondary | ICD-10-CM | POA: Diagnosis not present

## 2024-06-09 DIAGNOSIS — F902 Attention-deficit hyperactivity disorder, combined type: Secondary | ICD-10-CM | POA: Diagnosis not present

## 2024-06-09 DIAGNOSIS — F4311 Post-traumatic stress disorder, acute: Secondary | ICD-10-CM | POA: Diagnosis not present

## 2024-06-13 ENCOUNTER — Encounter: Payer: Self-pay | Admitting: Radiology

## 2024-06-13 ENCOUNTER — Ambulatory Visit: Admitting: Radiology

## 2024-06-13 VITALS — BP 108/66 | HR 67 | Temp 98.1°F | Wt 136.0 lb

## 2024-06-13 DIAGNOSIS — N760 Acute vaginitis: Secondary | ICD-10-CM

## 2024-06-13 DIAGNOSIS — R300 Dysuria: Secondary | ICD-10-CM

## 2024-06-13 LAB — URINALYSIS, COMPLETE W/RFL CULTURE
Bacteria, UA: NONE SEEN /HPF
Bilirubin Urine: NEGATIVE
Casts: NONE SEEN /LPF
Crystals: NONE SEEN /HPF
Glucose, UA: NEGATIVE
Hgb urine dipstick: NEGATIVE
Ketones, ur: NEGATIVE
Leukocyte Esterase: NEGATIVE
Nitrites, Initial: NEGATIVE
Protein, ur: NEGATIVE
RBC / HPF: NONE SEEN /HPF (ref 0–2)
Specific Gravity, Urine: 1.02 (ref 1.001–1.035)
WBC, UA: NONE SEEN /HPF (ref 0–5)
Yeast: NONE SEEN /HPF
pH: 6 (ref 5.0–8.0)

## 2024-06-13 LAB — WET PREP FOR TRICH, YEAST, CLUE

## 2024-06-13 LAB — NO CULTURE INDICATED

## 2024-06-13 MED ORDER — METRONIDAZOLE 500 MG PO TABS: 500.0000 mg | ORAL_TABLET | Freq: Two times a day (BID) | ORAL | 0 refills | Status: DC

## 2024-06-13 NOTE — Progress Notes (Signed)
      Subjective: [name] [name] is a 28 y.o. female who complains of dysuria, odor in urine, vaginal discharge, itching, no urinary frequency or urgency. Symptoms x's 3-4 days.     Review of Systems  All other systems reviewed and are negative.   Past Medical History:  Diagnosis Date   Chlamydia 2021   Depression    Fever blister    HSV-1 infection       Objective:  Today's Vitals   06/13/24 1420  BP: 108/66  Pulse: 67  Temp: 98.1 F (36.7 C)  TempSrc: Oral  SpO2: 99%  Weight: 136 lb (61.7 kg)   Body mass index is 19.8 kg/m.   Physical Exam Vitals and nursing note reviewed. Exam conducted with a chaperone present.  Constitutional:      Appearance: Normal appearance. She is well-developed.  Pulmonary:     Effort: Pulmonary effort is normal.  Abdominal:     General: Abdomen is flat.     Palpations: Abdomen is soft.  Genitourinary:    General: Normal vulva.     Vagina: Vaginal discharge present. No erythema, bleeding or lesions.     Cervix: Normal. No discharge, friability, lesion or erythema.     Uterus: Normal.      Adnexa: Right adnexa normal and left adnexa normal.  Neurological:     Mental Status: She is alert.  Psychiatric:        Mood and Affect: Mood normal.        Thought Content: Thought content normal.        Judgment: Judgment normal.     Urine dipstick shows negative for all components.  Micro exam: negative for WBC's or RBC's.  Microscopic wet-mount exam shows clue cells.   Darice Hoit, CMA present for exam  Assessment:/Plan:  1. Dysuria (Primary) Reassured negative UA - Urinalysis,Complete w/RFL Culture  2. Acute vaginitis +BV - WET PREP FOR TRICH, YEAST, CLUE - metroNIDAZOLE  (FLAGYL ) 500 MG tablet; Take 1 tablet (500 mg total) by mouth 2 (two) times daily.  Dispense: 14 tablet; Refill: 0  Will contact patient with results of testing completed today. Avoid intercourse until symptoms are resolved. Safe sex encouraged. Avoid the use  of soaps or perfumed products in the peri area. Avoid tub baths and sitting in sweaty or wet clothing for prolonged periods of time.   No follow-ups on file.   Mckinze Poirier B, NP 2:31 PM

## 2024-06-19 NOTE — Progress Notes (Signed)
 28 y.o. G50P0000 Married Caucasian female here for annual exam.  Wants to discuss IUD removal earlier than March.   Had bilateral breast surgery last year including drainage of abscess.   Has a fluttering in her lower abdomen.  It is a pulsating sensation, that is random.    Has had this in the past.    Has treatment for BV 06/13/24.   Took Flagyl  bid for one week.  Had negative urinalysis.    No bleeding in 6 - 7 months.   No painful intercourse.   Promotion at work.  Moved into a new home.   PCP: Patient, No Pcp Per   No LMP recorded. (Menstrual status: IUD).           Sexually active: Yes.    The current method of family planning is IUD Kyleena IUD 11-13-19.    Menopausal hormone therapy:  n/a Exercising: Yes.    Walking  Smoker:  no  OB History  Gravida Para Term Preterm AB Living  0 0 0 0 0 0  SAB IAB Ectopic Multiple Live Births  0 0 0 0 0     HEALTH MAINTENANCE: Last 2 paps:  06/19/23 neg HR HPV neg, 01/27/20 neg  History of abnormal Pap or positive HPV:  no Mammogram:   n/a Colonoscopy:  n/a Bone Density:  n/a  Result  n/a   Immunization History  Administered Date(s) Administered   DTaP 08/18/1998, 10/19/1998, 12/21/1998, 01/04/2000, 09/11/2003   HIB (PRP-T) 08/18/1998, 10/19/1998, 01/04/2000   HPV 9-valent 04/28/2022   Hepatitis B 12/21/1998, 03/25/1999, 09/11/2003   Hepatitis B, PED/ADOLESCENT 12/21/1998, 03/25/1999, 09/11/2003   IPV 08/18/1998, 10/19/1998, 01/04/2000, 09/11/2003   Influenza,Quad,Nasal, Live 06/23/2009, 09/20/2010, 05/17/2012   MMR 06/16/1999, 09/11/2003   Meningococcal Conjugate 05/17/2012, 08/01/2016   Pneumococcal Conjugate-13 03/25/1999, 06/16/1999, 01/04/2000   Tdap 06/23/2009, 08/01/2016   Varicella 06/16/1999, 05/17/2012      reports that she has never smoked. She has never used smokeless tobacco. She reports current alcohol use of about 2.0 standard drinks of alcohol per week. She reports that she does not use  drugs.  Past Medical History:  Diagnosis Date   Chlamydia 2021   Depression    Fever blister    HSV-1 infection     Past Surgical History:  Procedure Laterality Date   BREAST BIOPSY Right 07/13/2023   US  RT BREAST BX W LOC DEV 1ST LESION IMG BX SPEC US  GUIDE 07/13/2023 [facility] MAMMOGRAPHY   BREAST CYST EXCISION Bilateral 07/30/2023   Procedure: BILATERAL BREAST MASS EXCISIONAL BIOPSY;  Surgeon: Ebbie Cough, MD;  Location: [facility] OR;  Service: General;  Laterality: Bilateral;   INTRAUTERINE DEVICE (IUD) INSERTION     inserted 11-13-19   KIDNEY SURGERY  12/1999   repair of renal reflux    Current Outpatient Medications  Medication Sig Dispense Refill   amphetamine-dextroamphetamine (ADDERALL XR) 20 MG 24 hr capsule Take 20 mg by mouth in the morning.     amphetamine-dextroamphetamine (ADDERALL) 10 MG tablet Take 10 mg by mouth daily in the afternoon.     levonorgestrel (KYLEENA) 19.5 MG IUD by Intrauterine route once.     nitrofurantoin , macrocrystal-monohydrate, (MACROBID ) 100 MG capsule Take 1 capsule (100 mg total) by mouth as needed (as needed with intercourse). 30 capsule 1   propranolol (INDERAL) 10 MG tablet Take 10-20 mg by mouth 2 (two) times daily as needed (anxiety).     valACYclovir  (VALTREX ) 1000 MG tablet Take one tablet (1000 mg) by mouth daily for  prevention.  Take 2 tablets (2000 mg) by mouth twice a day for one day for an outbreak. 90 tablet 3   No current facility-administered medications for this visit.    ALLERGIES: Codeine  History reviewed. No pertinent family history.  Review of Systems  All other systems reviewed and are negative.   PHYSICAL EXAM:  BP 122/70 (BP Location: Left Arm, Patient Position: Sitting)   Pulse 75   Ht 5' 9.25 (1.759 m)   Wt 139 lb (63 kg)   SpO2 92%   BMI 20.38 kg/m     General appearance: alert, cooperative and appears stated age Head: normocephalic, without obvious abnormality, atraumatic Neck: no adenopathy, supple,  symmetrical, trachea midline and thyroid normal to inspection and palpation Lungs: clear to auscultation bilaterally Breasts: normal appearance, no masses or tenderness, No nipple retraction or dimpling, No nipple discharge or bleeding, No axillary adenopathy Heart: regular rate and rhythm Abdomen: this, soft, non-tender; no masses, no organomegaly, aortal pulsation noted and patient able to feel this with me.  Extremities: extremities normal, atraumatic, no cyanosis or edema Skin: skin color, texture, turgor normal. No rashes or lesions Lymph nodes: cervical, supraclavicular, and axillary nodes normal. Neurologic: grossly normal  Pelvic: External genitalia:  no lesions              No abnormal inguinal nodes palpated.              Urethra:  normal appearing urethra with no masses, tenderness or lesions              Bartholins and Skenes: normal                 Vagina: normal appearing vagina with normal color and discharge, no lesions              Cervix: no lesions.  IUD strings noted.               Pap taken: no Bimanual Exam:  Uterus:  normal size, contour, position, consistency, mobility, non-tender              Adnexa: no mass, fullness, tenderness           Chaperone was present for exam:  Kari HERO, CMA  ASSESSMENT: Well woman visit with gynecologic exam. Kyleena IUD.    Hx left breast fibroadenoma.  Hx right breast fibroadenoma. Status post left breast incisional biopsy and drainage of left breast abscess, right breast fibroma excision.   Hx surgery for reflux of urine.   HSV I orally. Postcoital UTIs controlled with Macrobid .  PHQ-2-9: 0  PLAN: Mammogram screening discussed. Self breast awareness reviewed. Pap and HRV collected:  no.  Due in 2029.  Guidelines for Calcium, Vitamin D, regular exercise program including cardiovascular and weight bearing exercise. Medication refills:  Valtrex , Macrobid .  Preconception counseling done.  Start PNV.  Avoidance of exposures:   tobacco, ETOH, unnecessary medications, cat litter box, uncooked or unprocessed foods.  She will see her provider regarding her Adderall and Propranolol for pregnancy planning.  Rubella titer on another day.   Flu vaccine recommended.  She will do with her husband.   Reading recommended, What to Expect Before You're Expecting.  She will wait for her IUD removal until next year.    Follow up:  yearly and prn.

## 2024-06-23 ENCOUNTER — Encounter: Payer: Self-pay | Admitting: Obstetrics and Gynecology

## 2024-06-23 ENCOUNTER — Ambulatory Visit: Payer: BC Managed Care – PPO | Admitting: Obstetrics and Gynecology

## 2024-06-23 VITALS — BP 122/70 | HR 75 | Ht 69.25 in | Wt 139.0 lb

## 2024-06-23 DIAGNOSIS — Z3169 Encounter for other general counseling and advice on procreation: Secondary | ICD-10-CM

## 2024-06-23 DIAGNOSIS — Z1331 Encounter for screening for depression: Secondary | ICD-10-CM

## 2024-06-23 DIAGNOSIS — Z01419 Encounter for gynecological examination (general) (routine) without abnormal findings: Secondary | ICD-10-CM | POA: Diagnosis not present

## 2024-06-23 MED ORDER — VALACYCLOVIR HCL 1 G PO TABS: ORAL_TABLET | ORAL | 3 refills | Status: AC

## 2024-06-23 MED ORDER — NITROFURANTOIN MONOHYD MACRO 100 MG PO CAPS: 100.0000 mg | ORAL_CAPSULE | ORAL | 1 refills | Status: DC | PRN

## 2024-06-23 NOTE — Patient Instructions (Signed)

## 2024-08-18 ENCOUNTER — Telehealth: Admitting: Nurse Practitioner

## 2024-08-18 DIAGNOSIS — B3731 Acute candidiasis of vulva and vagina: Secondary | ICD-10-CM | POA: Diagnosis not present

## 2024-08-18 MED ORDER — FLUCONAZOLE 150 MG PO TABS: ORAL_TABLET | ORAL | 0 refills | Status: AC

## 2024-08-18 NOTE — Progress Notes (Signed)

## 2024-09-19 ENCOUNTER — Telehealth: Payer: Self-pay

## 2024-09-19 NOTE — Telephone Encounter (Signed)
 Received a RX request for the nitrofurantoin  Mono-MCR 100 MG last RX fill date 08/23/24.   LM asking pt to call the office.   We need to know if she is in need of the medication?   .Med refill request: See above  Last AEX: 06/23/24 Next AEX: not scheduled  Last MMG (if hormonal med) Refill authorized: Please Advise?

## 2024-09-24 ENCOUNTER — Other Ambulatory Visit: Payer: Self-pay

## 2024-09-24 MED ORDER — NITROFURANTOIN MONOHYD MACRO 100 MG PO CAPS: 100.0000 mg | ORAL_CAPSULE | ORAL | 2 refills | Status: AC | PRN

## 2024-09-24 NOTE — Telephone Encounter (Signed)
 Med refill request:     nitrofurantoin , macrocrystal-monohydrate, (MACROBID ) 100 MG capsule  Start:  06/23/24 Disp: 30  tablets Refills:  1  Last AEX:  06/23/24 Next OV:  10/24/24   Last MMG (if hormonal med):  N/A Refill authorized? Please Advise.

## 2024-09-24 NOTE — Telephone Encounter (Signed)
 Med refill request:     NITROFURANTOIN  MONOHYD MACRO 100 MG PO CAPS

## 2024-10-24 ENCOUNTER — Ambulatory Visit: Admitting: Radiology
